# Patient Record
Sex: Male | Born: 1985 | Race: White | Hispanic: No | Marital: Single | Smoking: Former smoker
Health system: Southern US, Community
[De-identification: ages and names within clinical notes are randomized; demographics above are authoritative.]

---
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
## 2003-01-26 ENCOUNTER — Encounter: Admission: RE | Admit: 2003-01-26 | Discharge: 2003-01-26 | Payer: Self-pay | Admitting: Psychiatry

## 2003-10-12 ENCOUNTER — Other Ambulatory Visit (HOSPITAL_COMMUNITY): Admission: RE | Admit: 2003-10-12 | Discharge: 2003-10-21 | Payer: Self-pay | Admitting: Psychiatry

## 2005-02-26 ENCOUNTER — Ambulatory Visit (HOSPITAL_COMMUNITY): Payer: Self-pay | Admitting: Psychiatry

## 2005-07-03 ENCOUNTER — Ambulatory Visit (HOSPITAL_COMMUNITY): Payer: Self-pay | Admitting: Psychiatry

## 2007-01-15 ENCOUNTER — Ambulatory Visit: Payer: Self-pay | Admitting: Internal Medicine

## 2007-01-15 DIAGNOSIS — R51 Headache: Secondary | ICD-10-CM | POA: Insufficient documentation

## 2007-01-15 DIAGNOSIS — F329 Major depressive disorder, single episode, unspecified: Secondary | ICD-10-CM | POA: Insufficient documentation

## 2007-01-15 DIAGNOSIS — R519 Headache, unspecified: Secondary | ICD-10-CM | POA: Insufficient documentation

## 2007-01-15 DIAGNOSIS — F32A Depression, unspecified: Secondary | ICD-10-CM | POA: Insufficient documentation

## 2007-01-15 DIAGNOSIS — F988 Other specified behavioral and emotional disorders with onset usually occurring in childhood and adolescence: Secondary | ICD-10-CM | POA: Insufficient documentation

## 2007-01-15 LAB — CONVERTED CEMR LAB
ALT: 24 units/L (ref 0–53)
AST: 23 units/L (ref 0–37)
Albumin: 4 g/dL (ref 3.5–5.2)
Alkaline Phosphatase: 53 units/L (ref 39–117)
BUN: 11 mg/dL (ref 6–23)
Basophils Absolute: 0 10*3/uL (ref 0.0–0.1)
Basophils Relative: 0.7 % (ref 0.0–1.0)
Bilirubin Urine: NEGATIVE
Bilirubin, Direct: 0.2 mg/dL (ref 0.0–0.3)
CO2: 29 meq/L (ref 19–32)
Calcium: 9.3 mg/dL (ref 8.4–10.5)
Chloride: 105 meq/L (ref 96–112)
Cholesterol: 113 mg/dL (ref 0–200)
Creatinine, Ser: 1.1 mg/dL (ref 0.4–1.5)
Crystals: NEGATIVE
Eosinophils Absolute: 0.1 10*3/uL (ref 0.0–0.6)
Eosinophils Relative: 1.6 % (ref 0.0–5.0)
GFR calc Af Amer: 108 mL/min
GFR calc non Af Amer: 89 mL/min
Glucose, Bld: 103 mg/dL — ABNORMAL HIGH (ref 70–99)
HCT: 45.1 % (ref 39.0–52.0)
HDL: 26 mg/dL — ABNORMAL LOW (ref >39.0)
Hemoglobin, Urine: NEGATIVE
Hemoglobin: 16.1 g/dL (ref 13.0–17.0)
Ketones, ur: NEGATIVE mg/dL
LDL Cholesterol: 72 mg/dL (ref 0–99)
Leukocytes, UA: NEGATIVE
Lymphocytes Relative: 26.2 % (ref 12.0–46.0)
MCHC: 35.7 g/dL (ref 30.0–36.0)
MCV: 84.8 fL (ref 78.0–100.0)
Monocytes Absolute: 0.5 10*3/uL (ref 0.2–0.7)
Monocytes Relative: 7.8 % (ref 3.0–11.0)
Mucus, UA: NEGATIVE
Neutro Abs: 3.8 10*3/uL (ref 1.4–7.7)
Neutrophils Relative %: 63.7 % (ref 43.0–77.0)
Nitrite: NEGATIVE
Platelets: 213 10*3/uL (ref 150–400)
Potassium: 3.9 meq/L (ref 3.5–5.1)
RBC / HPF: NONE SEEN
RBC: 5.32 M/uL (ref 4.22–5.81)
RDW: 11.8 % (ref 11.5–14.6)
Sodium: 139 meq/L (ref 135–145)
Specific Gravity, Urine: 1.025 (ref 1.000–1.03)
TSH: 1.28 microintl units/mL (ref 0.35–5.50)
Total Bilirubin: 1.4 mg/dL — ABNORMAL HIGH (ref 0.3–1.2)
Total CHOL/HDL Ratio: 4.3
Total Protein, Urine: NEGATIVE mg/dL
Total Protein: 7 g/dL (ref 6.0–8.3)
Triglycerides: 77 mg/dL (ref 0–149)
Urine Glucose: NEGATIVE mg/dL
Urobilinogen, UA: 0.2 (ref 0.0–1.0)
VLDL: 15 mg/dL (ref 0–40)
WBC: 5.9 10*3/uL (ref 4.5–10.5)
pH: 6 (ref 5.0–8.0)

## 2007-09-01 ENCOUNTER — Emergency Department (HOSPITAL_COMMUNITY): Admission: EM | Admit: 2007-09-01 | Discharge: 2007-09-01 | Payer: Self-pay | Admitting: Emergency Medicine

## 2007-09-03 ENCOUNTER — Ambulatory Visit: Payer: Self-pay | Admitting: Internal Medicine

## 2007-09-03 DIAGNOSIS — K047 Periapical abscess without sinus: Secondary | ICD-10-CM | POA: Insufficient documentation

## 2007-12-22 ENCOUNTER — Emergency Department (HOSPITAL_COMMUNITY): Admission: EM | Admit: 2007-12-22 | Discharge: 2007-12-22 | Payer: Self-pay | Admitting: Emergency Medicine

## 2008-11-09 ENCOUNTER — Telehealth (INDEPENDENT_AMBULATORY_CARE_PROVIDER_SITE_OTHER): Payer: Self-pay | Admitting: *Deleted

## 2008-11-18 ENCOUNTER — Ambulatory Visit: Payer: Self-pay | Admitting: Internal Medicine

## 2008-11-18 DIAGNOSIS — J069 Acute upper respiratory infection, unspecified: Secondary | ICD-10-CM | POA: Insufficient documentation

## 2009-06-21 ENCOUNTER — Telehealth: Payer: Self-pay | Admitting: Internal Medicine

## 2010-07-18 NOTE — Progress Notes (Signed)
Summary: rx refill  Phone Note Call from Patient   Caller: Patient [phone] Summary of Call: pt called requesting refillls of Adderall XR 30mg  Initial call taken by: Margaret Pyle, CMA,  June 21, 2009 4:57 PM  Follow-up for Phone Call        done hardcopy to LIM side B - dahlia  Follow-up by: Corwin Levins MD,  June 21, 2009 5:37 PM  Additional Follow-up for Phone Call Additional follow up Details #1::        pt's father informed, rx in cabinet for pt pick up Additional Follow-up by: Margaret Pyle, CMA,  June 22, 2009 8:32 AM    New/Updated Medications: ADDERALL XR 30 MG XR24H-CAP (AMPHETAMINE-DEXTROAMPHETAMINE) 1 by mouth once daily - to fill Jun 22, 2009 Prescriptions: ADDERALL XR 30 MG XR24H-CAP (AMPHETAMINE-DEXTROAMPHETAMINE) 1 by mouth once daily - to fill Jun 22, 2009  #30 x 0   Entered and Authorized by:   Corwin Levins MD   Signed by:   Corwin Levins MD on 06/21/2009   Method used:   Print then Give to Patient   RxID:   [id number]

## 2012-10-18 ENCOUNTER — Encounter (HOSPITAL_COMMUNITY): Payer: Self-pay | Admitting: Emergency Medicine

## 2012-10-18 ENCOUNTER — Emergency Department (HOSPITAL_COMMUNITY)
Admission: EM | Admit: 2012-10-18 | Discharge: 2012-10-18 | Disposition: A | Discharge status: Home / Self Care | Payer: Self-pay | Attending: Emergency Medicine | Admitting: Emergency Medicine

## 2012-10-18 DIAGNOSIS — K029 Dental caries, unspecified: Secondary | ICD-10-CM | POA: Insufficient documentation

## 2012-10-18 DIAGNOSIS — Z87891 Personal history of nicotine dependence: Secondary | ICD-10-CM | POA: Insufficient documentation

## 2012-10-18 DIAGNOSIS — K089 Disorder of teeth and supporting structures, unspecified: Secondary | ICD-10-CM | POA: Insufficient documentation

## 2012-10-18 DIAGNOSIS — K047 Periapical abscess without sinus: Secondary | ICD-10-CM | POA: Insufficient documentation

## 2012-10-18 MED ORDER — METHYLPREDNISOLONE SODIUM SUCC 125 MG IJ SOLR: 125.0000 mg | Freq: Once | INTRAMUSCULAR | Status: DC

## 2012-10-18 MED ORDER — ONDANSETRON 4 MG PO TBDP
4.0000 mg | ORAL_TABLET | Freq: Once | ORAL | Status: AC
  Administered 2012-10-18: 4 mg via ORAL
  Filled 2012-10-18: qty 1

## 2012-10-18 MED ORDER — HYDROCODONE-ACETAMINOPHEN 5-325 MG PO TABS: 1.0000 | ORAL_TABLET | ORAL | Status: DC | PRN

## 2012-10-18 MED ORDER — PENICILLIN V POTASSIUM 500 MG PO TABS: 500.0000 mg | ORAL_TABLET | Freq: Four times a day (QID) | ORAL | Status: AC

## 2012-10-18 MED ORDER — METHYLPREDNISOLONE SODIUM SUCC 125 MG IJ SOLR
125.0000 mg | Freq: Once | INTRAMUSCULAR | Status: AC
  Administered 2012-10-18: 125 mg via INTRAMUSCULAR
  Filled 2012-10-18: qty 2

## 2012-10-18 NOTE — ED Provider Notes (Signed)
History  This chart was scribed for non-physician practitioner working with Gwyneth Sprout, MD by Ardeen Jourdain, ED Scribe. This patient was seen in room WTR7/WTR7 and the patient's care was started at  1503.  CSN: [id number]  Arrival date & time 10/18/12  1411   First MD Initiated Contact with Patient 10/18/12 1503      Chief Complaint  Patient presents with  . Oral Swelling     The history is provided by the patient. No language interpreter was used.    Derrick Goodman is a 27 y.o. male who presents to the Emergency Department complaining of gradually worsening oral swelling and pain of his right upper gums and roof of his mouth beginning 2 days ago.  Notes there was some bleeding from his gums earlier today when he brushed his teeth.  Notes that he is aware his teeth are in poor dentition but is uninsured and has not been able to see a dentist in quite some time.  Denies any fever, sweats, chills, difficulty swallowing or breathing.  No trismus.  Prior dental abscess with similar sx.  History reviewed. No pertinent past medical history.  History reviewed. No pertinent past surgical history.  No family history on file.  History  Substance Use Topics  . Smoking status: Former Smoker -- 0.25 packs/day for 1.5 years    Types: Cigarettes    Quit date: 06/19/2007  . Smokeless tobacco: Never Used  . Alcohol Use: Yes     Comment: Occassinally       Review of Systems  HENT: Positive for dental problem.   All other systems reviewed and are negative.    Allergies  Oxycodone-acetaminophen  Home Medications  No current outpatient prescriptions on file.  Triage Vitals: BP 139/87  Pulse 75  Temp(Src) 99.1 F (37.3 C) (Oral)  Resp 18  SpO2 100%  Physical Exam  Nursing note and vitals reviewed. Constitutional: He is oriented to person, place, and time. He appears well-developed and well-nourished.  HENT:  Head: Normocephalic and atraumatic. No trismus in the jaw.   Mouth/Throat: Uvula is midline, oropharynx is clear and moist and mucous membranes are normal. Abnormal dentition. Dental abscesses and dental caries present. No oropharyngeal exudate, posterior oropharyngeal edema, posterior oropharyngeal erythema or tonsillar abscesses.    TTP of right upper molars, localized swelling and erythema- concern for dental abscess; no oropharyngeal edema or erythema, handling secretions appropriately, no trismus  Eyes: Conjunctivae and EOM are normal. Pupils are equal, round, and reactive to light.  Neck: Normal range of motion. Neck supple.  Cardiovascular: Normal rate, regular rhythm and normal heart sounds.   Pulmonary/Chest: Effort normal and breath sounds normal.  Musculoskeletal: Normal range of motion.  Neurological: He is alert and oriented to person, place, and time.  Skin: Skin is warm and dry.  Psychiatric: He has a normal mood and affect.    ED Course  Procedures (including critical care time)  DIAGNOSTIC STUDIES: Oxygen Saturation is 100% on room air, normal by my interpretation.    COORDINATION OF CARE:  3:08 PM-Discussed treatment plan which includes antibiotics and pain medication with pt at bedside and pt agreed to plan.    Labs Reviewed - No data to display No results found.   1. Dental abscess       MDM   Right upper molar broken, TTP.  Localized swelling and erythema- concern for possible dental abscess formation.  Solu-medrol given in the ED.  Rx pen VK, vicodin.  FU with dentist-  Dr. Oswaldo Done.  Discussed plan with pt- he agreed.  Return precautions advised.     I personally performed the services described in this documentation, which was scribed in my presence. The recorded information has been reviewed and is accurate.    Garlon Hatchet, PA-C 10/18/12 1734

## 2012-10-18 NOTE — ED Notes (Signed)
Pt c/o 4/10 pain located in the roof of his mouth. Pt states he noticed the swelling this morning and is concerned about infection. Pt also reports headache, nausea, and headache.

## 2012-10-18 NOTE — ED Provider Notes (Signed)
Medical screening examination/treatment/procedure(s) were performed by non-physician practitioner and as supervising physician I was immediately available for consultation/collaboration.   Gwyneth Sprout, MD 10/18/12 2312

## 2014-04-25 ENCOUNTER — Encounter (HOSPITAL_COMMUNITY): Payer: Self-pay | Admitting: Emergency Medicine

## 2014-04-25 ENCOUNTER — Emergency Department (HOSPITAL_COMMUNITY)
Admission: EM | Admit: 2014-04-25 | Discharge: 2014-04-25 | Disposition: A | Discharge status: Home / Self Care | Payer: BC Managed Care – PPO | Attending: Emergency Medicine | Admitting: Emergency Medicine

## 2014-04-25 ENCOUNTER — Emergency Department (HOSPITAL_COMMUNITY): Payer: BC Managed Care – PPO

## 2014-04-25 DIAGNOSIS — R0789 Other chest pain: Secondary | ICD-10-CM | POA: Diagnosis not present

## 2014-04-25 DIAGNOSIS — R079 Chest pain, unspecified: Secondary | ICD-10-CM | POA: Diagnosis present

## 2014-04-25 DIAGNOSIS — Z791 Long term (current) use of non-steroidal anti-inflammatories (NSAID): Secondary | ICD-10-CM | POA: Diagnosis not present

## 2014-04-25 DIAGNOSIS — Z87891 Personal history of nicotine dependence: Secondary | ICD-10-CM | POA: Insufficient documentation

## 2014-04-25 LAB — I-STAT CHEM 8, ED
BUN: 12 mg/dL (ref 6–23)
CREATININE: 1.1 mg/dL (ref 0.50–1.35)
Calcium, Ion: 1.15 mmol/L (ref 1.12–1.23)
Chloride: 102 mEq/L (ref 96–112)
Glucose, Bld: 113 mg/dL — ABNORMAL HIGH (ref 70–99)
HCT: 51 % (ref 39.0–52.0)
HEMOGLOBIN: 17.3 g/dL — AB (ref 13.0–17.0)
Potassium: 4 mEq/L (ref 3.7–5.3)
SODIUM: 140 meq/L (ref 137–147)
TCO2: 35 mmol/L (ref 0–100)

## 2014-04-25 LAB — CBC WITH DIFFERENTIAL/PLATELET
Basophils Absolute: 0 10*3/uL (ref 0.0–0.1)
Basophils Relative: 0 % (ref 0–1)
Eosinophils Absolute: 0.2 10*3/uL (ref 0.0–0.7)
Eosinophils Relative: 2 % (ref 0–5)
HEMATOCRIT: 48.2 % (ref 39.0–52.0)
HEMOGLOBIN: 16.9 g/dL (ref 13.0–17.0)
LYMPHS PCT: 28 % (ref 12–46)
Lymphs Abs: 2.1 10*3/uL (ref 0.7–4.0)
MCH: 29.2 pg (ref 26.0–34.0)
MCHC: 35.1 g/dL (ref 30.0–36.0)
MCV: 83.2 fL (ref 78.0–100.0)
MONO ABS: 0.6 10*3/uL (ref 0.1–1.0)
MONOS PCT: 9 % (ref 3–12)
Neutro Abs: 4.5 10*3/uL (ref 1.7–7.7)
Neutrophils Relative %: 61 % (ref 43–77)
Platelets: 217 10*3/uL (ref 150–400)
RBC: 5.79 MIL/uL (ref 4.22–5.81)
RDW: 12.2 % (ref 11.5–15.5)
WBC: 7.5 10*3/uL (ref 4.0–10.5)

## 2014-04-25 LAB — I-STAT TROPONIN, ED: Troponin i, poc: 0 ng/mL (ref 0.00–0.08)

## 2014-04-25 MED ORDER — GI COCKTAIL ~~LOC~~
30.0000 mL | Freq: Once | ORAL | Status: AC
  Administered 2014-04-25: 30 mL via ORAL
  Filled 2014-04-25: qty 30

## 2014-04-25 MED ORDER — KETOROLAC TROMETHAMINE 30 MG/ML IJ SOLN
30.0000 mg | Freq: Once | INTRAMUSCULAR | Status: AC
  Administered 2014-04-25: 30 mg via INTRAVENOUS
  Filled 2014-04-25: qty 1

## 2014-04-25 MED ORDER — NAPROXEN 500 MG PO TABS: 500.0000 mg | ORAL_TABLET | Freq: Two times a day (BID) | ORAL | Status: DC

## 2014-04-25 NOTE — Discharge Instructions (Signed)
Please follow the directions provided.  Be sure to establish care with a primary care provider to follow-up on your pain today and to discuss issues with anxiety.  Get plenty of rest today eat a balanced diet and drink adequate amount of non-alcoholic fluids.  You may take naproxen 500 mg by mouth twice a day for this pain.  Don't hesitate to return for new, worsening or concerning symptoms.    SEEK IMMEDIATE MEDICAL CARE IF:  You have increased chest pain or pain that spreads to your arm, neck, jaw, back, or abdomen.  You have shortness of breath.  You have an increasing cough, or you cough up blood.  You have severe back or abdominal pain.  You feel nauseous or vomit.  You have severe weakness.  You faint.  You have chills. This is an emergency. Do not wait to see if the pain will go away. Get medical help at once. Call your local emergency services (911 in U.S[place] Do not drive yourself to the hospital.   Emergency Department Resource Guide 1) Find a Doctor and Pay Out of Pocket Although you won't have to find out who is covered by your insurance plan, it is a good idea to ask around and get recommendations. You will then need to call the office and see if the doctor you have chosen will accept you as a new patient and what types of options they offer for patients who are self-pay. Some doctors offer discounts or will set up payment plans for their patients who do not have insurance, but you will need to ask so you aren't surprised when you get to your appointment.  2) Contact Your Local Health Department Not all health departments have doctors that can see patients for sick visits, but many do, so it is worth a call to see if yours does. If you don't know where your local health department is, you can check in your phone book. The CDC also has a tool to help you locate your state's health department, and many state websites also have listings of all of their local health departments.  3) Find  a Walk-in Clinic If your illness is not likely to be very severe or complicated, you may want to try a walk in clinic. These are popping up all over the country in pharmacies, drugstores, and shopping centers. They're usually staffed by nurse practitioners or physician assistants that have been trained to treat common illnesses and complaints. They're usually fairly quick and inexpensive. However, if you have serious medical issues or chronic medical problems, these are probably not your best option.  No Primary Care Doctor: - Call Health Connect at  [phone] - they can help you locate a primary care doctor that  accepts your insurance, provides certain services, etc. - Physician Referral Service- [phone]  Chronic Pain Problems: Organization         Address  Phone   Notes  [facility]  [phone] Patients need to be referred by their primary care doctor.   Medication Assistance: Organization         Address  Phone   Notes  [facility] Medication [facility] [address] [place], [place] [address] [phone] --Must be a resident of [facility] -- Must have NO insurance coverage whatsoever (no Medicaid/ Medicare, etc.) -- The pt. MUST have a primary care doctor that directs their care regularly and follows them in the community   MedAssist  (  [phone]) [phone]   [employer]  [phone]    Agencies that provide inexpensive medical care: Organization         Address  Phone   Notes  [facility]  [phone]   [facility]    [phone](336) [phone]   [facility] [address] [place], [place] [address] [phone](336) [phone]   [facility] [address], [place] [phone](336) [phone]   [facility]anned Parenthood    [phone](336) [phone]   [facility]    [phone](336) [phone]   [facility]  [address], [place] Phone:  [phone](336) [phone], Fax:  [phone])  [phone] Hours of Operation:  9 am - 6 pm, M-F.  Also accepts Medicaid/Medicare and self-pay.  [facility]  [address], [place] Phone: [phone]) [phone], Fax: [phone](336) [phone]. Hours of Operation:  8:30 am - 5:30 pm, M-F.  Also accepts Medicaid and self-pay.  [facility] [place] [address], [place] Phone: [phone](336) [phone]   [facility] [address], [place], [place] [phone], Ext. [phone] Mondays & Thursdays: 7-9 AM.  First 15 patients are seen on a first come, first serve basis.    Medicaid-accepting [facility] Providers:  Organization         Address  Phone   Notes  [facility] [address], [place] [phone](336) [phone] Also accepts self-pay patients.  [facility] [address], [place]  [phone](336) [phone]   [facility] [address], [place] [phone](336) [phone]   [facility] [address] [place] [address], [place] [phone](336) [phone]   Renaye RakersVeita Bland [address], [place]   [phone]) [phone] Only accepts [place] Access [place] patients after they have their name applied to their card.   Self-Pay (no insurance) in [facility]:  Organization         Address  Phone   Notes  Sickle Cell Patients, [facility] [address], [place] [phone]) [phone]   [facility] [address], [place] [phone]) [phone]   [facility] [place]  [address], [place] [phone](336) [phone]   [facility]/Dr. Osei-Bonsu  [address] [place] [address], [place] or [phone], [place] [phone](336) [phone] Phone number for both [place] and [place] locations is the same.  [facility] [address], [place] [phone](336) [phone]   [facility] [place] [address] [place] [address], [place] or [address] [phone]) [phone]) [phone]     [facility] [address], [place] [phone](336) [phone], phone; [phone](336) [phone], fax Sees patients 1st and 3rd Saturday of every month.  Must not qualify for public or private insurance (i.e. Medicaid, Medicare, [place] Health Choice, Veterans' Benefits)  Household income should be no more than 200% of the poverty level The clinic cannot treat you if you are pregnant or think you are pregnant  Sexually transmitted diseases are not treated at the clinic.    Dental Care: Organization         Address  Phone  Notes  [facility] of [facility] [address], [place] [phone]) [phone] Accepts children up to age 30 who are enrolled in [place]icaid or [place] Health Choice; pregnant  women with a Medicaid card; and children who have applied for Medicaid or [place] Health Choice, but were declined, whose parents can pay a reduced fee at time of service.  [facility] of [facility]  [address], [place] [phone] Accepts children up to age 54 who are enrolled in [place] or [place] Health Choice; pregnant women with a Medicaid card; and children who have applied for Medicaid or [place] Health Choice, but were declined, whose parents can pay a reduced fee at time of service.  [facility]  [address] [place], [place] [phone] Patients are seen by appointment only. Walk-ins are not accepted. [facility] will see patients 52 years of age and older. Monday - Tuesday (8am-5pm) Most Wednesdays (8:30-5pm) $30 per visit, cash only  [facility] Adult [facility] Access PRO[facility]  [address], [facility] [phone] Patients are seen by appointment only. Walk-ins are not accepted. [facility] will see patients 75 years of age and older. One Wednesday Evening (Monthly: Volunteer Based).  $30 per visit, cash only  [employer]  [phone] for adults; Children under age 30, call  Graduate Pediatric Dentistry at [phone]. Children aged 24-14, please call [phone] to request a pediatric application.  Dental services are provided in all areas of dental care including fillings, crowns and bridges, complete and partial dentures, implants, gum treatment, root canals, and extractions. Preventive care is also provided. Treatment is provided to both adults and children. Patients are selected via a lottery and there is often a waiting list.   [facility] [address], [place]  [phone] [email or web address]   [facility] [address] [place], [place] [phone], Ext. [phone] Second and Fourth Thursday of each month, opens at 6:30 AM; Clinic ends at 9 AM.  Patients are seen on a first-come first-served basis, and a limited number are seen during each clinic.   [facility]  [address] [place], [place] [phone]   Eligibility Requirements You must have lived in [place], [place], or [place] counties for at least the last three months.   You cannot be eligible for state or federal sponsored [place], including [employer], [place], or [employer].   You generally cannot be eligible for healthcare insurance through your employer.    How to apply: Eligibility screenings are held every Tuesday and Wednesday afternoon from 1:00 pm until 4:00 pm. You do not need an appointment for the interview!  [facility] [address] [place], [place] [phone]   [facility] Health Department  [phone]   [facility] Health Department  [phone]   [facility] Health Department  [phone]    Behavioral Health Resources in the Community: Intensive Outpatient Programs Organization         Address  Phone  Notes  [facility] [address], [place], [place] [phone]   [facility] Outpatient [address] [place], [place]  [phone]   ADS: Alcohol & Drug Svcs [address], [place], [place]  [phone]   [facility] [address]  [place], [place] [phone] or [phone]   Substance Abuse Resources Organization         Address  Phone  Notes  Alcohol and Drug Services  [phone]   [facility]  [phone]   [place]  [phone]  [facility]  [phone]   Residential & Outpatient Substance Abuse Program  [phone]   Psychological Services Organization         Address  Phone  Notes  [facility]  [phone]- [phone]   [facility]  [phone]- [phone]   [facility] [address], [place] [phone] or [phone]    Mobile Crisis Teams Organization         Address  Phone  Notes  [facility], Mobile Crisis Care Unit  [phone]   [facility]  [address] [place], [place] [phone]   Doristine LocksSharon DeEsch [address] [place] [phone]    Self-Help/Support Groups Organization         Address  Phone             Notes  Mental Health Assoc. of [place] - variety of support groups  336- [phone] Call for more information  Narcotics Anonymous (NA), [facility] [address], [place]  2 meetings at this location   Statisticianesidential Treatment Programs Organization         Address  Phone  Notes  [facility] [address],    [place]  [phone]   [facility]  [address], [place] [phone], [place], [place] [phone] Floydene Flock  [facility] [address], [place] [phone] Admissions: 8am-3pm M-F  [facility] [address]    [place], [place] [phone]   [facility] [address], [place], [place] [phone]   [facility] [address]  [place], [place] [phone]   [facility] - Intensive Outpatient [address], [place], [place] [phone]   [facility] ([facility] Recovery Care [facility] [address]  [place], [place] [phone] or [phone]   [facility] (RTS) [address] [place], [place] [phone] Accepts Medicaid  [facility] [address]  [place] [phone] Substance Abuse/Addiction Treatment   [facility] Behavioral Health Resources Organization         Address  Phone  Notes  [facility]  [phone]) [phone]   Angie FavaJulie Brannon, PhD [address], [address] [place], [place]   [phone](336) [phone] or [phone](336) [phone]   [facility]   [address] [place], [place] [phone](336) [phone]   [facility] [address], [place], [place] [phone](336) [phone] Insurance/Medicaid/sponsorship through [facility]  [facility] Families [address]                                    [place], [place] [phone](336) [phone] Therapy/tele-psych/case  [facility] [address].   [place], [place] [phone](336) [phone]    Dr. Lolly MustacheArfeen  [phone](336) [phone]   [facility]  [employer] [facility] 1) [address], [place] 2) [address], [place] 3)  [address], [place] [phone](336) [phone](336) [phone]  [phone](336) [phone]   [facility] Child Abuse Hotline [phone](336) [phone] or [phone](336) [phone] (After Hours)
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
## 2014-04-25 NOTE — ED Notes (Signed)
Pt escorted to discharge window. Pt verbalized understanding discharge instructions. In no acute distress.  

## 2014-04-25 NOTE — ED Notes (Signed)
Pt alert and oriented x4. Respirations even and unlabored, bilateral symmetrical rise and fall of chest. Skin warm and dry. In no acute distress. Denies needs.   
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
## 2016-03-19 ENCOUNTER — Ambulatory Visit (INDEPENDENT_AMBULATORY_CARE_PROVIDER_SITE_OTHER): Payer: Self-pay | Admitting: Family Medicine

## 2016-03-19 VITALS — BP 130/90 | HR 75 | Temp 97.3°F | Resp 17 | Ht 67.0 in | Wt 190.0 lb

## 2016-03-19 DIAGNOSIS — K047 Periapical abscess without sinus: Secondary | ICD-10-CM

## 2016-03-19 MED ORDER — MELOXICAM 15 MG PO TABS: 15.0000 mg | ORAL_TABLET | Freq: Every day | ORAL | 0 refills | Status: DC

## 2016-03-19 MED ORDER — AMOXICILLIN 500 MG PO CAPS: 500.0000 mg | ORAL_CAPSULE | Freq: Two times a day (BID) | ORAL | 0 refills | Status: DC

## 2016-03-19 NOTE — Progress Notes (Signed)
   Subjective:    Patient ID: Derrick Goodman, male    DOB: Aug 22, 1985, 30 y.o.   MRN: [id number]  Chief Complaint  Patient presents with  . jaw pain  . Hypertension    PCP: Oliver BarreJames John, MD  HPI  This is a 30 y.o. male who is presenting with jaw pain. Had TMJ on Thursday and Friday. He hasn't slept due to pain on the right side. He called his dentist and isn't able to be seen. Pain is on top and bottom. Has planned on pulling his teeth for partial dentures. No dental procedures for the past 6 months. No recent antibiotics. Having throbbing in the right side. Has had emesis. No fevers, chills, or night sweats. No trouble swallowing or breathing.  Has tried ibuprofen with no improvement.   Blood pressure is elevated but no history of HTN. He has been in pain from his teeth.  .   Review of Systems  ROS: No unexpected weight loss, fever, chills, swelling, instability, muscle pain, numbness/tingling, redness, otherwise see HPI   PMH: dental abscess   PShx: none  PSx: no tobacco or alcohol use  FHx: DM    Objective:   Physical Exam BP 130/90   Pulse 75   Temp 97.3 F (36.3 C) (Oral)   Resp 17   Ht 5\' 7"  (1.702 m)   Wt 190 lb (86.2 kg)   SpO2 97%   BMI 29.76 kg/m  Gen: NAD, alert, cooperative with exam, well-appearing HEENT: TM's clear and intact, no cervical LAD, no tonsillar swelling or exudates, several tooth caries, no ttp of the cheek,   Resp:  non-labored Skin: no rashes, normal turgor  Neuro: no gross deficits.  Psych:  alert and oriented      Assessment & Plan:   ABSCESS, TOOTH Pain is most likely related to abscess. No problems swallowing or breathing. He has several dental caries.  - amoxicllin but informed he doesn't have to complete if he gets his teeth pulled  - mobic for pain  - advised to f/u in regards to his blood pressure.

## 2016-03-19 NOTE — Assessment & Plan Note (Signed)
Pain is most likely related to abscess. No problems swallowing or breathing. He has several dental caries.  - amoxicllin but informed he doesn't have to complete if he gets his teeth pulled  - mobic for pain  - advised to f/u in regards to his blood pressure.

## 2016-03-19 NOTE — Patient Instructions (Addendum)
  Thank you for coming in,   Please let us know if you are not able to get into your dentist soon.     Please feel free to call with any questions or concerns at any time, at [phone]. --Dr. Jordan LikesSchmitz    IF you received an x-ray today, you will receive an invoice from [facility]. Please contact [facility] at [phone] with questions or concerns regarding your invoice.   IF you received labwork today, you will receive an invoice from [facility]. Please contact [facility] at [phone] with questions or concerns regarding your invoice.   Our billing staff will not be able to assist you with questions regarding bills from these companies.  You will be contacted with the lab results as soon as they are available. The fastest way to get your results is to activate your My Chart account. Instructions are located on the last page of this paperwork. If you have not heard from us regarding the results in 2 weeks, please contact this office.

## 2016-03-19 NOTE — Progress Notes (Addendum)
Patient discussed with Dr. Jordan LikesSchmitz. Agree with findings, assessment and plan of care per his note.  Signed,   Meredith StaggersJeffrey Dereon Williamsen, MD [facility].  03/21/16 11:42 AM

## 2018-02-10 ENCOUNTER — Ambulatory Visit: Payer: Self-pay | Admitting: Family Medicine

## 2018-03-10 ENCOUNTER — Ambulatory Visit: Payer: Self-pay | Admitting: Family Medicine

## 2018-10-23 ENCOUNTER — Emergency Department (HOSPITAL_COMMUNITY)
Admission: EM | Admit: 2018-10-23 | Discharge: 2018-10-24 | Disposition: A | Discharge status: Home / Self Care | Payer: No Typology Code available for payment source | Attending: Emergency Medicine | Admitting: Emergency Medicine

## 2018-10-23 ENCOUNTER — Other Ambulatory Visit: Payer: Self-pay

## 2018-10-23 ENCOUNTER — Encounter (HOSPITAL_COMMUNITY): Payer: Self-pay

## 2018-10-23 DIAGNOSIS — K0889 Other specified disorders of teeth and supporting structures: Secondary | ICD-10-CM | POA: Diagnosis not present

## 2018-10-23 DIAGNOSIS — K029 Dental caries, unspecified: Secondary | ICD-10-CM | POA: Diagnosis not present

## 2018-10-23 DIAGNOSIS — R509 Fever, unspecified: Secondary | ICD-10-CM | POA: Insufficient documentation

## 2018-10-23 NOTE — ED Triage Notes (Signed)
Pt woke up today with swelling to the R side of upper molar. Reports fevers today

## 2018-10-24 MED ORDER — AMOXICILLIN-POT CLAVULANATE 875-125 MG PO TABS: 1.0000 | ORAL_TABLET | Freq: Two times a day (BID) | ORAL | 0 refills | Status: AC

## 2018-10-24 NOTE — Discharge Instructions (Addendum)
You were given a prescription for antibiotics. Please take the antibiotic prescription fully.   Please follow-up with a dentist in the next 5 to 7 days for reevaluation.  If you do not have a dentist, resources were provided for dentist in the area in your discharge summary.  Please contact one of the offices that are listed and make an appointment for follow-up.  Please return to the emergency department for any new or worsening symptoms.  

## 2018-10-24 NOTE — ED Provider Notes (Signed)
[facility] Provider Note   CSN: [id number] Arrival date & time: 10/23/18  2340    History   Chief Complaint Chief Complaint  Patient presents with  . Oral Swelling    HPI Derrick Goodman is a 33 y.o. male.     HPI   Patient is a 33 year old male who presents to the emergency department today complaining of right upper dental pain that began earlier today.  Reports that later on in the day he developed increased pain and swelling to the right upper portion of his mouth.  Pain is constant and severe in nature.  Denies exacerbating or alleviating factors.  States he had a temp of 100 at home.  Denies any other symptoms including no nausea or vomiting.  He has had no drainage in his mouth.  No difficulty swallowing or sore throat.  States that he has a Education officer, communitydentist but did not try to contact them today.  History reviewed. No pertinent past medical history.  Patient Active Problem List   Diagnosis Date Noted  . URI 11/18/2008  . ABSCESS, TOOTH 09/03/2007  . DEPRESSION 01/15/2007  . ADD 01/15/2007  . HEADACHE 01/15/2007    History reviewed. No pertinent surgical history.      Home Medications    Prior to Admission medications   Medication Sig Start Date End Date Taking? Authorizing Provider  amoxicillin (AMOXIL) 500 MG capsule Take 1 capsule (500 mg total) by mouth 2 (two) times daily. 03/19/16   Myra RudeSchmitz, Josh E, MD  amoxicillin-clavulanate (AUGMENTIN) 875-125 MG tablet Take 1 tablet by mouth every 12 (twelve) hours for 7 days. 10/24/18 10/31/18  Shoshanah Dapper S, PA-C  meloxicam (MOBIC) 15 MG tablet Take 1 tablet (15 mg total) by mouth daily. 03/19/16   Myra RudeSchmitz, Lyrik E, MD    Family History No family history on file.  Social History Social History   Tobacco Use  . Smoking status: Former Smoker    Packs/day: 0.25    Years: 1.50    Pack years: 0.37    Types: Cigarettes    Last attempt to quit: 06/19/2007    Years since  quitting: 11.3  . Smokeless tobacco: Never Used  Substance Use Topics  . Alcohol use: Yes    Comment: Occassinally   . Drug use: No     Allergies   Oxycodone-acetaminophen   Review of Systems Review of Systems  Constitutional: Positive for fever.  HENT: Positive for dental problem.   Gastrointestinal: Negative for nausea and vomiting.  Neurological: Negative for headaches.     Physical Exam Updated Vital Signs BP (!) 143/99   Pulse 93   Temp 98.4 F (36.9 C)   Resp 20   SpO2 98%   Physical Exam Constitutional:      General: He is not in acute distress.    Appearance: He is well-developed. He is not ill-appearing or toxic-appearing.  HENT:     Nose: Nose normal.     Mouth/Throat:     Mouth: Mucous membranes are moist.     Pharynx: No oropharyngeal exudate or posterior oropharyngeal erythema.     Comments: Multiple missing teeth and dental caries.  Has tenderness to the right upper gumline without any obvious evidence of fluctuance.  No drainage noted.  No obvious periapical abscess. Eyes:     Conjunctiva/sclera: Conjunctivae normal.  Cardiovascular:     Rate and Rhythm: Normal rate and regular rhythm.  Pulmonary:     Effort: Pulmonary effort is normal.  Breath sounds: Normal breath sounds.  Skin:    General: Skin is warm and dry.  Neurological:     Mental Status: He is alert and oriented to person, place, and time.      ED Treatments / Results  Labs (all labs ordered are listed, but only abnormal results are displayed) Labs Reviewed - No data to display  EKG None  Radiology No results found.  Procedures Procedures (including critical care time)  Medications Ordered in ED Medications - No data to display   Initial Impression / Assessment and Plan / ED Course  I have reviewed the triage vital signs and the nursing notes.  Pertinent labs & imaging results that were available during my care of the patient were reviewed by me and considered in  my medical decision making (see chart for details).     Final Clinical Impressions(s) / ED Diagnoses   Final diagnoses:  Pain, dental   Patient with toothache.  Multiple missing teeth and dental caries.  Has tenderness to the right upper gumline without any obvious evidence of fluctuance.  No drainage noted.  No obvious periapical abscess. No gross abscess.  Exam unconcerning for Ludwig's angina or spread of infection.  Will treat with augmentin and pain medicine.  Urged patient to follow-up with dentist.  He states he will call his dentist tomorrow morning.  I also gave him resources to contact another dentist in the area if he is unable to have an appointment with his.  Advised and return to the ER for new or worsening symptoms.  He voices understanding of the plan and reasons to return.  All questions answered.  Patient stable for discharge.   ED Discharge Orders         Ordered    amoxicillin-clavulanate (AUGMENTIN) 875-125 MG tablet  Every 12 hours     10/24/18 0252           Karrie Meres, PA-C 10/24/18 0253    Nira Conn, MD 10/24/18 [phone]

## 2019-03-30 ENCOUNTER — Other Ambulatory Visit: Payer: Self-pay

## 2019-03-30 ENCOUNTER — Encounter (HOSPITAL_COMMUNITY): Payer: Self-pay | Admitting: Family Medicine

## 2019-03-30 ENCOUNTER — Ambulatory Visit (HOSPITAL_COMMUNITY)
Admission: EM | Admit: 2019-03-30 | Discharge: 2019-03-30 | Disposition: A | Discharge status: Home / Self Care | Payer: No Typology Code available for payment source | Attending: Family Medicine | Admitting: Family Medicine

## 2019-03-30 DIAGNOSIS — K2970 Gastritis, unspecified, without bleeding: Secondary | ICD-10-CM

## 2019-03-30 MED ORDER — SUCRALFATE 1 GM/10ML PO SUSP: 1.0000 g | Freq: Three times a day (TID) | ORAL | 0 refills | Status: AC

## 2019-03-30 MED ORDER — OMEPRAZOLE 20 MG PO CPDR: 20.0000 mg | DELAYED_RELEASE_CAPSULE | Freq: Two times a day (BID) | ORAL | 1 refills | Status: AC

## 2019-03-30 NOTE — Discharge Instructions (Signed)
If you are not improving with the omeprazole, stop the omeprazole and start taking the new liquid stomach prescription 3 times a day.  If this does not result in resolution, call the gastroenterologist below.

## 2019-03-30 NOTE — ED Triage Notes (Signed)
Pt presents with epigastric pain for past few days that is more active with eating.

## 2019-03-30 NOTE — ED Provider Notes (Signed)
[facility]    CSN: [id number] Arrival date & time: 03/30/19  1157      History   Chief Complaint Chief Complaint  Patient presents with  . Appointment  . (12:10) Abdominal Pain    HPI Derrick Goodman is a 33 y.o. male.   This is the initial [facility] urgent care visit for this 33 year old man who presents with abdominal pain.  Patient states that his epigastric pain began about 3 days ago.  He describes as gnawing and burning and located in the upper part of his abdomen.  He says it gets worse when he eats.  He has had no nausea, vomiting, or diarrhea.  He has had no hematochezia, fever, or prior episodes of this kind of pain.  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  Patient has been taking Tums but this has not done anything to the pain.  He rates pain is 5 out of 10.  He says that eating tends to make the pain a little worse.  It is not positional.     History reviewed. No pertinent past medical history.  Patient Active Problem List   Diagnosis Date Noted  . URI 11/18/2008  . ABSCESS, TOOTH 09/03/2007  . DEPRESSION 01/15/2007  . ADD 01/15/2007  . HEADACHE 01/15/2007    History reviewed. No pertinent surgical history.     Home Medications    Prior to Admission medications   Medication Sig Start Date End Date Taking? Authorizing Provider  omeprazole (PRILOSEC) 20 MG capsule Take 1 capsule (20 mg total) by mouth 2 (two) times daily before a meal. 03/30/19   Elvina Sidle, MD  sucralfate (CARAFATE) 1 GM/10ML suspension Take 10 mLs (1 g total) by mouth 4 (four) times daily -  with meals and at bedtime. 03/30/19   Elvina Sidle, MD    Family History Family History  Family history unknown: Yes    Social History Social History   Tobacco Use  . Smoking status: Former Smoker    Packs/day: 0.25    Years: 1.50    Pack years: 0.37    Types: Cigarettes    Quit date: 06/19/2007    Years since quitting: 11.7  .  Smokeless tobacco: Never Used  Substance Use Topics  . Alcohol use: Yes    Comment: Occassinally   . Drug use: No     Allergies   Oxycodone-acetaminophen   Review of Systems Review of Systems  Constitutional: Negative.   Gastrointestinal: Positive for abdominal pain. Negative for constipation, diarrhea, nausea and vomiting.  All other systems reviewed and are negative.    Physical Exam Triage Vital Signs ED Triage Vitals [03/30/19 1209]  Enc Vitals Group     BP (!) 141/93     Pulse Rate 90     Resp 16     Temp 98.1 F (36.7 C)     Temp Source Temporal     SpO2 99 %     Weight      Height      Head Circumference      Peak Flow      Pain Score      Pain Loc      Pain Edu?      Excl. in GC?    No data found.  Updated Vital Signs BP (!) 141/93 (BP Location: Left Arm)   Pulse 90   Temp 98.1 F (36.7 C) (Temporal)   Resp 16   SpO2 99%  Physical Exam Vitals signs and nursing note reviewed.  Constitutional:      Appearance: Normal appearance. He is normal weight.  Eyes:     Conjunctiva/sclera: Conjunctivae normal.  Neck:     Musculoskeletal: Normal range of motion.  Cardiovascular:     Rate and Rhythm: Normal rate and regular rhythm.     Heart sounds: Normal heart sounds.  Pulmonary:     Effort: Pulmonary effort is normal.     Breath sounds: Normal breath sounds.  Abdominal:     General: Bowel sounds are normal. There is no distension.     Palpations: There is no mass.     Tenderness: There is abdominal tenderness. There is no guarding or rebound.     Comments: Mild tenderness in epigastrium with deep palpation  Musculoskeletal: Normal range of motion.  Skin:    General: Skin is warm.  Neurological:     General: No focal deficit present.     Mental Status: He is alert and oriented to person, place, and time.  Psychiatric:        Mood and Affect: Mood normal.        Thought Content: Thought content normal.        Judgment: Judgment normal.       UC Treatments / Results  Labs (all labs ordered are listed, but only abnormal results are displayed) Labs Reviewed - No data to display  EKG   Radiology No results found.  Procedures Procedures (including critical care time)  Medications Ordered in UC Medications - No data to display  Initial Impression / Assessment and Plan / UC Course  I have reviewed the triage vital signs and the nursing notes.  Pertinent labs & imaging results that were available during my care of the patient were reviewed by me and considered in my medical decision making (see chart for details).    Final Clinical Impressions(s) / UC Diagnoses   Final diagnoses:  Gastritis and gastroduodenitis     Discharge Instructions     If you are not improving with the omeprazole, stop the omeprazole and start taking the new liquid stomach prescription 3 times a day.  If this does not result in resolution, call the gastroenterologist below.    ED Prescriptions    Medication Sig Dispense Auth. Provider   omeprazole (PRILOSEC) 20 MG capsule Take 1 capsule (20 mg total) by mouth 2 (two) times daily before a meal. 20 capsule Robyn Haber, MD   sucralfate (CARAFATE) 1 GM/10ML suspension Take 10 mLs (1 g total) by mouth 4 (four) times daily -  with meals and at bedtime. 420 mL Robyn Haber, MD     I have reviewed the PDMP during this encounter.   Robyn Haber, MD 03/30/19 1245

## 2019-04-11 ENCOUNTER — Other Ambulatory Visit: Payer: Self-pay

## 2019-04-11 ENCOUNTER — Emergency Department (HOSPITAL_COMMUNITY): Payer: No Typology Code available for payment source

## 2019-04-11 ENCOUNTER — Emergency Department (HOSPITAL_COMMUNITY)
Admission: EM | Admit: 2019-04-11 | Discharge: 2019-04-11 | Disposition: A | Discharge status: Home / Self Care | Payer: No Typology Code available for payment source | Attending: Emergency Medicine | Admitting: Emergency Medicine

## 2019-04-11 ENCOUNTER — Encounter (HOSPITAL_COMMUNITY): Payer: Self-pay | Admitting: Emergency Medicine

## 2019-04-11 DIAGNOSIS — W25XXXA Contact with sharp glass, initial encounter: Secondary | ICD-10-CM | POA: Insufficient documentation

## 2019-04-11 DIAGNOSIS — Z79899 Other long term (current) drug therapy: Secondary | ICD-10-CM | POA: Diagnosis not present

## 2019-04-11 DIAGNOSIS — Z87891 Personal history of nicotine dependence: Secondary | ICD-10-CM | POA: Diagnosis not present

## 2019-04-11 DIAGNOSIS — Y990 Civilian activity done for income or pay: Secondary | ICD-10-CM | POA: Insufficient documentation

## 2019-04-11 DIAGNOSIS — Y9289 Other specified places as the place of occurrence of the external cause: Secondary | ICD-10-CM | POA: Insufficient documentation

## 2019-04-11 DIAGNOSIS — S61215A Laceration without foreign body of left ring finger without damage to nail, initial encounter: Secondary | ICD-10-CM | POA: Diagnosis not present

## 2019-04-11 DIAGNOSIS — S6992XA Unspecified injury of left wrist, hand and finger(s), initial encounter: Secondary | ICD-10-CM | POA: Diagnosis present

## 2019-04-11 DIAGNOSIS — Y9389 Activity, other specified: Secondary | ICD-10-CM | POA: Diagnosis not present

## 2019-04-11 DIAGNOSIS — Z23 Encounter for immunization: Secondary | ICD-10-CM | POA: Diagnosis not present

## 2019-04-11 MED ORDER — TETANUS-DIPHTH-ACELL PERTUSSIS 5-2.5-18.5 LF-MCG/0.5 IM SUSP
0.5000 mL | Freq: Once | INTRAMUSCULAR | Status: AC
  Administered 2019-04-11: 20:00:00 0.5 mL via INTRAMUSCULAR
  Filled 2019-04-11: qty 0.5

## 2019-04-11 NOTE — ED Provider Notes (Signed)
[facility]-EMERGENCY DEPT Provider Note   CSN: [id number] Arrival date & time: 04/11/19  1933     History   Chief Complaint Chief Complaint  Patient presents with  . Laceration    HPI Derrick Goodman is a 33 y.o. male who presents to the ED today after sustaining laceration to left ring finger that occurred at 2:30 PM today.  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  He states that they had to break the mirror prior to throwing it in the dumpster.  He states he sliced his finger on the broken piece of mirror causing the laceration.  He states that it has continued to bleed minimally since then which prompted his ED visit today.  He is not anticoagulated.  Tetanus not up-to-date.      History reviewed. No pertinent past medical history.  Patient Active Problem List   Diagnosis Date Noted  . URI 11/18/2008  . ABSCESS, TOOTH 09/03/2007  . DEPRESSION 01/15/2007  . ADD 01/15/2007  . HEADACHE 01/15/2007    History reviewed. No pertinent surgical history.      Home Medications    Prior to Admission medications   Medication Sig Start Date End Date Taking? Authorizing Provider  omeprazole (PRILOSEC) 20 MG capsule Take 1 capsule (20 mg total) by mouth 2 (two) times daily before a meal. 03/30/19   Elvina Sidle, MD  sucralfate (CARAFATE) 1 GM/10ML suspension Take 10 mLs (1 g total) by mouth 4 (four) times daily -  with meals and at bedtime. 03/30/19   Elvina Sidle, MD    Family History Family History  Family history unknown: Yes    Social History Social History   Tobacco Use  . Smoking status: Former Smoker    Packs/day: 0.25    Years: 1.50    Pack years: 0.37    Types: Cigarettes    Quit date: 06/19/2007    Years since quitting: 11.8  . Smokeless tobacco: Never Used  Substance Use Topics  . Alcohol use: Yes    Comment: Occassinally   . Drug use: No     Allergies    Oxycodone-acetaminophen   Review of Systems Review of Systems  Constitutional: Negative for chills and fever.  Skin: Positive for wound.     Physical Exam Updated Vital Signs BP (!) 144/103 (BP Location: Right Arm)   Pulse 72   Temp 98 F (36.7 C) (Oral)   Resp 18   SpO2 99%   Physical Exam Vitals signs and nursing note reviewed.  Constitutional:      Appearance: He is not ill-appearing.  HENT:     Head: Normocephalic and atraumatic.  Eyes:     Conjunctiva/sclera: Conjunctivae normal.  Cardiovascular:     Rate and Rhythm: Normal rate and regular rhythm.  Pulmonary:     Effort: Pulmonary effort is normal.     Breath sounds: Normal breath sounds.  Musculoskeletal:     Comments: 1 cm superficial laceration noted to distal pad of left ring finger; bleeding controlled at this time. Sensation intact to finger. Cap refill < 2 seconds. ROM intact to MCP, PIP, and DIP joint. 2+ radial pulse  Skin:    General: Skin is warm and dry.     Coloration: Skin is not jaundiced.  Neurological:     Mental Status: He is alert.      ED Treatments / Results  Labs (all labs ordered are listed, but  only abnormal results are displayed) Labs Reviewed - No data to display  EKG None  Radiology Dg Finger Ring Left  Result Date: 04/11/2019 CLINICAL DATA:  32 year old male with laceration of the ring finger. EXAM: LEFT RING FINGER 2+V COMPARISON:  None. FINDINGS: There is no acute fracture or dislocation. The bones are well mineralized. No arthritic changes. The soft tissues are unremarkable. No radiopaque foreign object or soft tissue gas. IMPRESSION: Negative. Electronically Signed   By: Anner Crete M.D.   On: 04/11/2019 21:06    Procedures .Marland KitchenLaceration Repair  Date/Time: 04/11/2019 9:12 PM Performed by: Eustaquio Maize, PA-C Authorized by: Eustaquio Maize, PA-C   Consent:    Consent obtained:  Verbal   Consent given by:  Patient   Risks discussed:  Infection, pain and  poor cosmetic result Anesthesia (see MAR for exact dosages):    Anesthesia method:  None Laceration details:    Location:  Finger   Finger location:  L ring finger   Length (cm):  1   Depth (mm):  1 Repair type:    Repair type:  Simple Exploration:    Hemostasis achieved with:  Direct pressure   Contaminated: no   Treatment:    Area cleansed with:  Betadine   Amount of cleaning:  Standard   Irrigation solution:  Sterile saline Skin repair:    Repair method:  Tissue adhesive Approximation:    Approximation:  Close Post-procedure details:    Dressing:  Open (no dressing)   Patient tolerance of procedure:  Tolerated well, no immediate complications   (including critical care time)  Medications Ordered in ED Medications  Tdap (BOOSTRIX) injection 0.5 mL (0.5 mLs Intramuscular Given 04/11/19 2018)     Initial Impression / Assessment and Plan / ED Course  I have reviewed the triage vital signs and the nursing notes.  Pertinent labs & imaging results that were available during my care of the patient were reviewed by me and considered in my medical decision making (see chart for details).    33 year old male presents with laceration to left ring finger that occurred around 2 PM today.  Bleeding is controlled at this time although patient states it has been oozing blood since he was cut.  Tetanus is not up-to-date.  Will update this in the ED today.  Given it was cut on a piece of broken glass will obtain x-ray to ensure there is no foreign body.  Laceration superficial enough to be closed with Dermabond.  Xray negative for foreign body.  Wound closed with Dermabond.  Strict return precautions have been discussed with patient.  He is in agreement with plan at this time and stable for discharge home.  This note was prepared using Dragon voice recognition software and may include unintentional dictation errors due to the inherent limitations of voice recognition software.       Final Clinical Impressions(s) / ED Diagnoses   Final diagnoses:  Laceration of left ring finger without foreign body without damage to nail, initial encounter    ED Discharge Orders    None       Eustaquio Maize, PA-C 04/11/19 2113    Sherwood Gambler, MD 04/12/19 2351

## 2019-04-11 NOTE — ED Triage Notes (Addendum)
Patient reports cutting left ring finger on mirror. Reports tetanus shot is up to date. Bleeding controlled.

## 2019-04-11 NOTE — Discharge Instructions (Signed)
Your xray was negative for any foreign body We have closed your laceration with skin glue. Please do not get your finger wet for 24 hours. After that you can resume normal activity. I would recommend not soaking your hand in a bath or sink of water.  Return to the ED immediately for any signs of infection including redness around the wound, swelling, drainage of pus, or if you develop fever > 100.4, or chills

## 2019-05-28 ENCOUNTER — Emergency Department (HOSPITAL_COMMUNITY)
Admission: EM | Admit: 2019-05-28 | Discharge: 2019-05-28 | Discharge status: Against Medical Advice | Payer: No Typology Code available for payment source | Attending: Emergency Medicine | Admitting: Emergency Medicine

## 2019-05-28 ENCOUNTER — Other Ambulatory Visit: Payer: Self-pay

## 2019-05-28 ENCOUNTER — Encounter (HOSPITAL_COMMUNITY): Payer: Self-pay

## 2019-05-28 DIAGNOSIS — R55 Syncope and collapse: Secondary | ICD-10-CM | POA: Diagnosis present

## 2019-05-28 DIAGNOSIS — Z5321 Procedure and treatment not carried out due to patient leaving prior to being seen by health care provider: Secondary | ICD-10-CM | POA: Insufficient documentation

## 2019-05-28 LAB — URINALYSIS, ROUTINE W REFLEX MICROSCOPIC
Bacteria, UA: NONE SEEN
Bilirubin Urine: NEGATIVE
Glucose, UA: NEGATIVE mg/dL
Ketones, ur: 5 mg/dL — AB
Leukocytes,Ua: NEGATIVE
Nitrite: NEGATIVE
Protein, ur: NEGATIVE mg/dL
Specific Gravity, Urine: 1.021 (ref 1.005–1.030)
pH: 5 (ref 5.0–8.0)

## 2019-05-28 LAB — CBC
HCT: 47 % (ref 39.0–52.0)
Hemoglobin: 16.4 g/dL (ref 13.0–17.0)
MCH: 29.3 pg (ref 26.0–34.0)
MCHC: 34.9 g/dL (ref 30.0–36.0)
MCV: 84.1 fL (ref 80.0–100.0)
Platelets: 245 10*3/uL (ref 150–400)
RBC: 5.59 MIL/uL (ref 4.22–5.81)
RDW: 12.2 % (ref 11.5–15.5)
WBC: 11 10*3/uL — ABNORMAL HIGH (ref 4.0–10.5)
nRBC: 0 % (ref 0.0–0.2)

## 2019-05-28 LAB — BASIC METABOLIC PANEL
Anion gap: 9 (ref 5–15)
BUN: 7 mg/dL (ref 6–20)
CO2: 24 mmol/L (ref 22–32)
Calcium: 9.1 mg/dL (ref 8.9–10.3)
Chloride: 105 mmol/L (ref 98–111)
Creatinine, Ser: 1.14 mg/dL (ref 0.61–1.24)
GFR calc Af Amer: >60 mL/min (ref >60)
GFR calc non Af Amer: >60 mL/min (ref >60)
Glucose, Bld: 121 mg/dL — ABNORMAL HIGH (ref 70–99)
Potassium: 3.6 mmol/L (ref 3.5–5.1)
Sodium: 138 mmol/L (ref 135–145)

## 2019-05-28 MED ORDER — SODIUM CHLORIDE 0.9% FLUSH: 3.0000 mL | Freq: Once | INTRAVENOUS | Status: DC

## 2019-05-28 NOTE — ED Notes (Signed)
Pt updated about wait times 

## 2019-05-28 NOTE — ED Notes (Signed)
Pt said that he had a tooth extraction this afternoon and thinks he only was weak because of this. States he will follow up in the a.m. with his dentist.

## 2019-05-28 NOTE — ED Triage Notes (Signed)
Pt states that he had a tooth pulled today, afterwards had fever of 101.5, vomited several times and near syncopal episode.

## 2020-05-27 IMAGING — CR DG FINGER RING 2+V*L*
3 series · 3 of 3 positions shown · non-contrast
Comparison: None.

CLINICAL DATA: 33-year-old male with laceration of the ring finger.

EXAM:
LEFT RING FINGER 2+V

[x finger pa left]
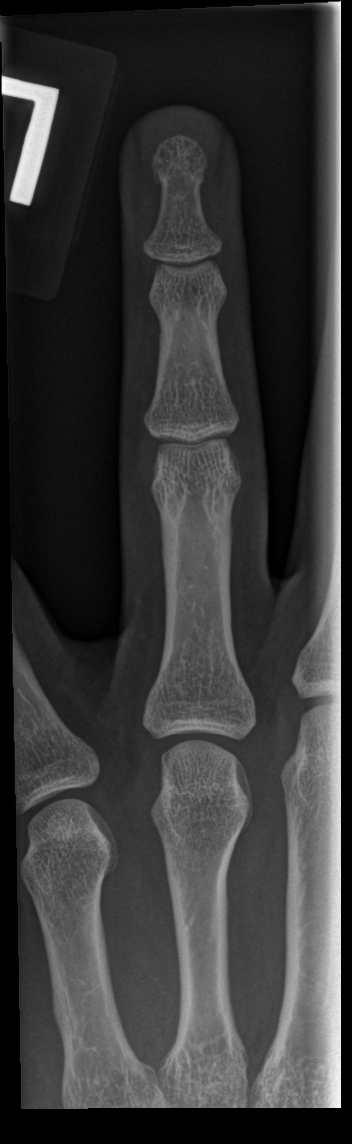

[x finger obl left]
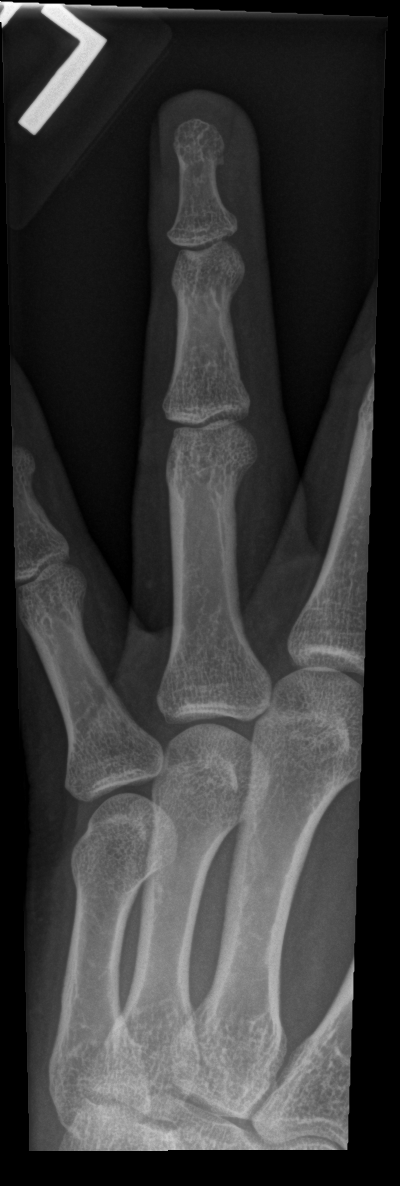

[x finger lat left]
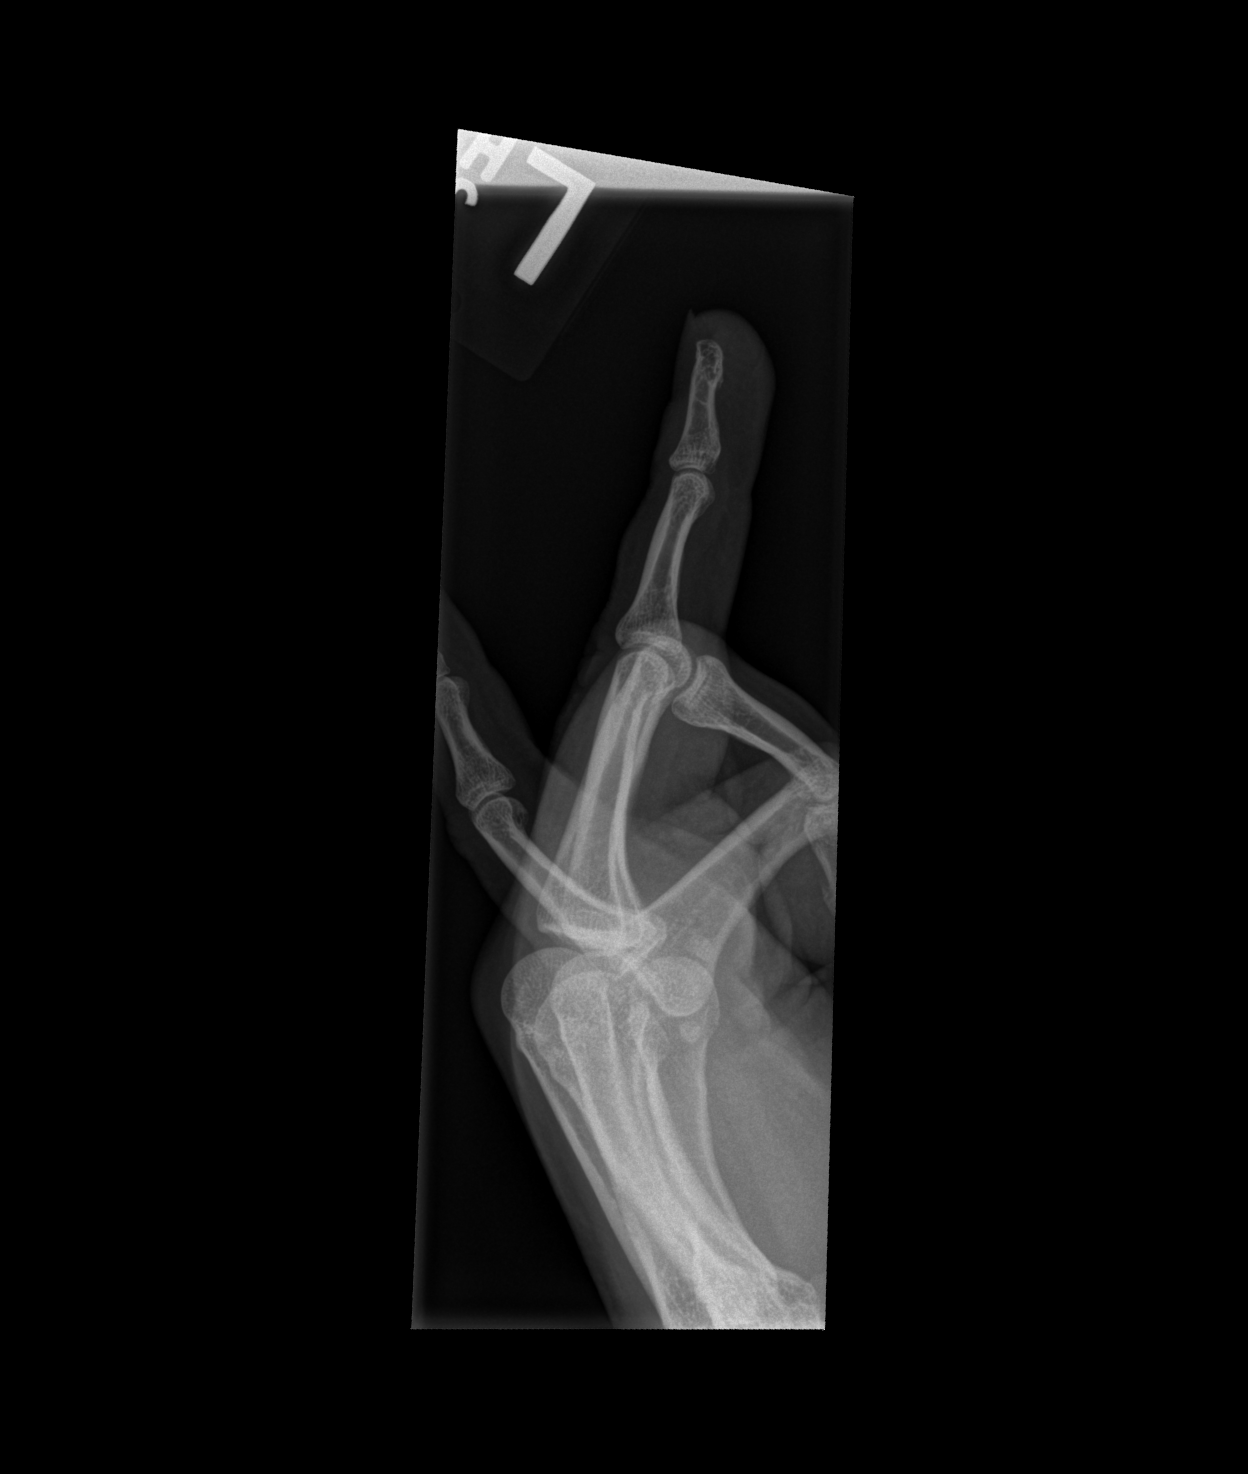

[3 of 3 positions shown; findings below may reference images not displayed]

FINDINGS: There is no acute fracture or dislocation. The bones are well
mineralized. No arthritic changes. The soft tissues are
unremarkable. No radiopaque foreign object or soft tissue gas.
IMPRESSION: Negative.

## 2020-06-24 DIAGNOSIS — Z20822 Contact with and (suspected) exposure to covid-19: Secondary | ICD-10-CM | POA: Diagnosis not present

## 2020-09-29 ENCOUNTER — Ambulatory Visit (HOSPITAL_COMMUNITY): Payer: Self-pay

## 2020-10-26 DIAGNOSIS — I10 Essential (primary) hypertension: Secondary | ICD-10-CM | POA: Diagnosis not present

## 2020-10-27 ENCOUNTER — Ambulatory Visit (HOSPITAL_COMMUNITY)
Admission: RE | Admit: 2020-10-27 | Discharge: 2020-10-27 | Disposition: A | Discharge status: Home / Self Care | Payer: BLUE CROSS/BLUE SHIELD | Source: Ambulatory Visit | Attending: Family Medicine | Admitting: Family Medicine

## 2020-10-27 ENCOUNTER — Other Ambulatory Visit: Payer: Self-pay

## 2020-10-27 ENCOUNTER — Encounter (HOSPITAL_COMMUNITY): Payer: Self-pay

## 2020-10-27 VITALS — BP 143/90 | HR 75 | Temp 98.2°F | Resp 18

## 2020-10-27 DIAGNOSIS — R030 Elevated blood-pressure reading, without diagnosis of hypertension: Secondary | ICD-10-CM | POA: Insufficient documentation

## 2020-10-27 DIAGNOSIS — R635 Abnormal weight gain: Secondary | ICD-10-CM | POA: Diagnosis not present

## 2020-10-27 DIAGNOSIS — R519 Headache, unspecified: Secondary | ICD-10-CM | POA: Insufficient documentation

## 2020-10-27 LAB — CBC WITH DIFFERENTIAL/PLATELET
Abs Immature Granulocytes: 0.01 10*3/uL (ref 0.00–0.07)
Basophils Absolute: 0.1 10*3/uL (ref 0.0–0.1)
Basophils Relative: 1 %
Eosinophils Absolute: 0.2 10*3/uL (ref 0.0–0.5)
Eosinophils Relative: 4 %
HCT: 50.3 % (ref 39.0–52.0)
Hemoglobin: 17.6 g/dL — ABNORMAL HIGH (ref 13.0–17.0)
Immature Granulocytes: 0 %
Lymphocytes Relative: 28 %
Lymphs Abs: 1.6 10*3/uL (ref 0.7–4.0)
MCH: 28.9 pg (ref 26.0–34.0)
MCHC: 35 g/dL (ref 30.0–36.0)
MCV: 82.6 fL (ref 80.0–100.0)
Monocytes Absolute: 0.4 10*3/uL (ref 0.1–1.0)
Monocytes Relative: 7 %
Neutro Abs: 3.4 10*3/uL (ref 1.7–7.7)
Neutrophils Relative %: 60 %
Platelets: 253 10*3/uL (ref 150–400)
RBC: 6.09 MIL/uL — ABNORMAL HIGH (ref 4.22–5.81)
RDW: 12 % (ref 11.5–15.5)
WBC: 5.7 10*3/uL (ref 4.0–10.5)
nRBC: 0 % (ref 0.0–0.2)

## 2020-10-27 LAB — COMPREHENSIVE METABOLIC PANEL
ALT: 78 U/L — ABNORMAL HIGH (ref 0–44)
AST: 36 U/L (ref 15–41)
Albumin: 4 g/dL (ref 3.5–5.0)
Alkaline Phosphatase: 59 U/L (ref 38–126)
Anion gap: 8 (ref 5–15)
BUN: 11 mg/dL (ref 6–20)
CO2: 25 mmol/L (ref 22–32)
Calcium: 9 mg/dL (ref 8.9–10.3)
Chloride: 104 mmol/L (ref 98–111)
Creatinine, Ser: 1.08 mg/dL (ref 0.61–1.24)
GFR, Estimated: >60 mL/min (ref >60)
Glucose, Bld: 90 mg/dL (ref 70–99)
Potassium: 4.2 mmol/L (ref 3.5–5.1)
Sodium: 137 mmol/L (ref 135–145)
Total Bilirubin: 1.2 mg/dL (ref 0.3–1.2)
Total Protein: 7 g/dL (ref 6.5–8.1)

## 2020-10-27 LAB — TSH: TSH: 1.879 u[IU]/mL (ref 0.350–4.500)

## 2020-10-27 MED ORDER — AMLODIPINE BESYLATE 5 MG PO TABS: 5.0000 mg | ORAL_TABLET | Freq: Every day | ORAL | 1 refills | Status: DC

## 2020-10-27 NOTE — ED Triage Notes (Signed)
Pt reports chest pain, left shoulder pain and headache x 3 days. Pt repost last night his  blood pressure 150/100.Denmies vision changes, nausea, dizziness.

## 2020-10-27 NOTE — ED Provider Notes (Signed)
[facility]    CSN: [id number] Arrival date & time: 10/27/20  0946      History   Chief Complaint Chief Complaint  Patient presents with  . Appointment    1000  . Chest Pain    HPI Derrick Goodman is a 35 y.o. male.   Patient presenting today for evaluation of several week history of elevated blood pressure readings, intermittent headaches posteriorly and flushing at times.  He states this has seemed worse the last 2 or 3 days and he was unable to get in with his primary care for another month so wanted to come in and get checked out.  Struggle with elevated blood pressure readings in the past from time to time and notes he gained a lot of weight during COVID which has gotten it more regularly elevated.  Home readings have been ranging from 140-150s/90-100. he denies chest pain, shortness of breath, dizziness, altered mental status, nausea vomiting or diaphoresis.  Strong family history on both sides of hypertension.  Has never been on medication for this in the past.     History reviewed. No pertinent past medical history.  Patient Active Problem List   Diagnosis Date Noted  . URI 11/18/2008  . ABSCESS, TOOTH 09/03/2007  . DEPRESSION 01/15/2007  . ADD 01/15/2007  . HEADACHE 01/15/2007    History reviewed. No pertinent surgical history.     Home Medications    Prior to Admission medications   Medication Sig Start Date End Date Taking? Authorizing Provider  amLODipine (NORVASC) 5 MG tablet Take 1 tablet (5 mg total) by mouth daily. 10/27/20  Yes Particia Nearing, PA-C  omeprazole (PRILOSEC) 20 MG capsule Take 1 capsule (20 mg total) by mouth 2 (two) times daily before a meal. 03/30/19   Elvina Sidle, MD  sucralfate (CARAFATE) 1 GM/10ML suspension Take 10 mLs (1 g total) by mouth 4 (four) times daily -  with meals and at bedtime. 03/30/19   Elvina Sidle, MD    Family History Family History  Problem Relation Age of Onset  . Hypertension  Mother   . Hypertension Father   . Heart disease Maternal Grandfather   . Migraines Maternal Grandfather   . Heart disease Maternal Grandmother   . Migraines Maternal Grandmother   . Heart disease Paternal Grandfather   . Migraines Paternal Grandfather   . Heart disease Paternal Grandmother   . Migraines Paternal Grandmother     Social History Social History   Tobacco Use  . Smoking status: Former Smoker    Packs/day: 0.25    Years: 1.50    Pack years: 0.37    Types: Cigarettes    Quit date: 06/19/2007    Years since quitting: 13.3  . Smokeless tobacco: Never Used  Substance Use Topics  . Alcohol use: Yes    Comment: Occassinally   . Drug use: No     Allergies   Oxycodone-acetaminophen and Oxycodone-acetaminophen   Review of Systems Review of Systems Per HPI  Physical Exam Triage Vital Signs ED Triage Vitals  Enc Vitals Group     BP 10/27/20 1005 (!) 143/90     Pulse Rate 10/27/20 1005 75     Resp 10/27/20 1005 18     Temp 10/27/20 1005 98.2 F (36.8 C)     Temp Source 10/27/20 1005 Oral     SpO2 10/27/20 1005 97 %     Weight --      Height --  Head Circumference --      Peak Flow --      Pain Score 10/27/20 1002 5     Pain Loc --      Pain Edu? --      Excl. in GC? --    No data found.  Updated Vital Signs BP (!) 143/90 (BP Location: Right Arm)   Pulse 75   Temp 98.2 F (36.8 C) (Oral)   Resp 18   SpO2 97%   Visual Acuity Right Eye Distance:   Left Eye Distance:   Bilateral Distance:    Right Eye Near:   Left Eye Near:    Bilateral Near:     Physical Exam Vitals and nursing note reviewed.  Constitutional:      Appearance: Normal appearance.  HENT:     Head: Atraumatic.     Mouth/Throat:     Mouth: Mucous membranes are moist.     Pharynx: Oropharynx is clear.  Eyes:     Extraocular Movements: Extraocular movements intact.     Conjunctiva/sclera: Conjunctivae normal.  Cardiovascular:     Rate and Rhythm: Normal rate and  regular rhythm.  Pulmonary:     Effort: Pulmonary effort is normal. No respiratory distress.     Breath sounds: Normal breath sounds. No wheezing or rales.  Abdominal:     General: Bowel sounds are normal. There is no distension.     Palpations: Abdomen is soft.     Tenderness: There is no abdominal tenderness. There is no guarding.  Musculoskeletal:        General: Normal range of motion.     Cervical back: Normal range of motion and neck supple.  Skin:    General: Skin is warm and dry.  Neurological:     General: No focal deficit present.     Mental Status: He is oriented to person, place, and time.  Psychiatric:        Mood and Affect: Mood normal.        Thought Content: Thought content normal.        Judgment: Judgment normal.     UC Treatments / Results  Labs (all labs ordered are listed, but only abnormal results are displayed) Labs Reviewed  CBC WITH DIFFERENTIAL/PLATELET  COMPREHENSIVE METABOLIC PANEL  TSH    EKG   Radiology No results found.  Procedures Procedures (including critical care time)  Medications Ordered in UC Medications - No data to display  Initial Impression / Assessment and Plan / UC Course  I have reviewed the triage vital signs and the nursing notes.  Pertinent labs & imaging results that were available during my care of the patient were reviewed by me and considered in my medical decision making (see chart for details).     Exam reassuring, vital signs significantly for very mildly elevated blood pressure reading.  EKG today normal sinus rhythm with no acute ST or T wave changes.  We will get some basic labs for rule out serious cause of symptoms, start on amlodipine given the consistency of his elevated home readings and concern for this matter and discussed DASH diet, exercise, stress reduction, close follow-up with PCP for recheck.  He will log his home readings and bring this to his follow-up next month with PCP.  Return sooner for  side effects or worsening symptoms.  Final Clinical Impressions(s) / UC Diagnoses   Final diagnoses:  Elevated blood pressure reading  Nonintractable episodic headache, unspecified headache type   Discharge  Instructions   None    ED Prescriptions    Medication Sig Dispense Auth. Provider   amLODipine (NORVASC) 5 MG tablet Take 1 tablet (5 mg total) by mouth daily. 30 tablet Particia Nearing, [place]     PDMP not reviewed this encounter.   Particia Nearing, [place] 10/27/20 1103

## 2020-12-22 ENCOUNTER — Other Ambulatory Visit: Payer: Self-pay | Admitting: Family Medicine

## 2020-12-26 ENCOUNTER — Encounter (HOSPITAL_COMMUNITY): Payer: Self-pay

## 2020-12-26 ENCOUNTER — Ambulatory Visit (HOSPITAL_COMMUNITY)
Admission: RE | Admit: 2020-12-26 | Discharge: 2020-12-26 | Disposition: A | Discharge status: Home / Self Care | Payer: BLUE CROSS/BLUE SHIELD | Source: Ambulatory Visit | Attending: Student | Admitting: Student

## 2020-12-26 ENCOUNTER — Other Ambulatory Visit: Payer: Self-pay

## 2020-12-26 VITALS — BP 138/93 | HR 73 | Temp 98.3°F | Resp 16

## 2020-12-26 DIAGNOSIS — I10 Essential (primary) hypertension: Secondary | ICD-10-CM | POA: Diagnosis not present

## 2020-12-26 DIAGNOSIS — Z760 Encounter for issue of repeat prescription: Secondary | ICD-10-CM

## 2020-12-26 MED ORDER — AMLODIPINE BESYLATE 5 MG PO TABS: 5.0000 mg | ORAL_TABLET | Freq: Every day | ORAL | 0 refills | Status: DC

## 2020-12-26 NOTE — ED Triage Notes (Signed)
Pt presents for amlodipine 5 mg refill.   Pt reports his blood pressure ir running high. States it was 147/100 2 hrs ago. Reports headache, ear pressure, neck pain. Denies chest pain, SOB, vision changes

## 2020-12-26 NOTE — Discharge Instructions (Addendum)
-  Continue amlodopine once daily -Please check your blood pressure at home or at the pharmacy. If this continues to be >140/90, follow-up with your primary care provider for further blood pressure management/ medication titration. If you develop chest pain, shortness of breath, vision changes, the worst headache of your life- head straight to the ED or call 911.

## 2020-12-26 NOTE — ED Provider Notes (Signed)
[facility]    CSN: [id number] Arrival date & time: 12/26/20  1543      History   Chief Complaint Chief Complaint  Patient presents with   Appointment    1600   Medication Refill    HPI Derrick Goodman is a 35 y.o. male presenting for medication refill-amlodipine.  Medical history hypertension, headaches.  States his blood pressure has been as high as 147/100 at home, he does endorse some mild headaches but denies chest pain, shortness of breath, vision changes, weakness, worst headache of life, thunderclap headache. He was first prescribed the amlodipine 2 months ago at our urgent care and is almost out.  HPI  History reviewed. No pertinent past medical history.  Patient Active Problem List   Diagnosis Date Noted   URI 11/18/2008   ABSCESS, TOOTH 09/03/2007   DEPRESSION 01/15/2007   ADD 01/15/2007   HEADACHE 01/15/2007    History reviewed. No pertinent surgical history.     Home Medications    Prior to Admission medications   Medication Sig Start Date End Date Taking? Authorizing Provider  amLODipine (NORVASC) 5 MG tablet Take 1 tablet (5 mg total) by mouth daily. 12/26/20   Rhys Martini, PA-C  omeprazole (PRILOSEC) 20 MG capsule Take 1 capsule (20 mg total) by mouth 2 (two) times daily before a meal. 03/30/19   Elvina Sidle, MD  sucralfate (CARAFATE) 1 GM/10ML suspension Take 10 mLs (1 g total) by mouth 4 (four) times daily -  with meals and at bedtime. 03/30/19   Elvina Sidle, MD    Family History Family History  Problem Relation Age of Onset   Hypertension Mother    Hypertension Father    Heart disease Maternal Grandfather    Migraines Maternal Grandfather    Heart disease Maternal Grandmother    Migraines Maternal Grandmother    Heart disease Paternal Grandfather    Migraines Paternal Grandfather    Heart disease Paternal Grandmother    Migraines Paternal Grandmother     Social History Social History   Tobacco Use    Smoking status: Former    Packs/day: 0.25    Years: 1.50    Pack years: 0.38    Types: Cigarettes    Quit date: 06/19/2007    Years since quitting: 13.5   Smokeless tobacco: Never  Vaping Use   Vaping Use: Never used  Substance Use Topics   Alcohol use: Yes    Comment: Occassinally    Drug use: No     Allergies   Oxycodone-acetaminophen and Oxycodone-acetaminophen   Review of Systems Review of Systems  All other systems reviewed and are negative.   Physical Exam Triage Vital Signs ED Triage Vitals [12/26/20 1645]  Enc Vitals Group     BP (!) 138/93     Pulse Rate 73     Resp 16     Temp 98.3 F (36.8 C)     Temp Source Oral     SpO2 100 %     Weight      Height      Head Circumference      Peak Flow      Pain Score 2     Pain Loc      Pain Edu?      Excl. in GC?    No data found.  Updated Vital Signs BP (!) 138/93 (BP Location: Left Arm)   Pulse 73   Temp 98.3 F (36.8 C) (Oral)   Resp 16  SpO2 100%   Visual Acuity Right Eye Distance:   Left Eye Distance:   Bilateral Distance:    Right Eye Near:   Left Eye Near:    Bilateral Near:     Physical Exam Vitals reviewed.  Constitutional:      Appearance: Normal appearance. He is not diaphoretic.  HENT:     Head: Normocephalic and atraumatic.     Mouth/Throat:     Mouth: Mucous membranes are moist.  Eyes:     Extraocular Movements: Extraocular movements intact.     Pupils: Pupils are equal, round, and reactive to light.  Cardiovascular:     Rate and Rhythm: Normal rate and regular rhythm.     Pulses:          Radial pulses are 2+ on the right side and 2+ on the left side.     Heart sounds: Normal heart sounds.  Pulmonary:     Effort: Pulmonary effort is normal.     Breath sounds: Normal breath sounds.  Abdominal:     Palpations: Abdomen is soft.     Tenderness: There is no abdominal tenderness. There is no guarding or rebound.  Musculoskeletal:     Right lower leg: No edema.     Left  lower leg: No edema.  Skin:    General: Skin is warm.     Capillary Refill: Capillary refill takes less than 2 seconds.  Neurological:     General: No focal deficit present.     Mental Status: He is alert and oriented to person, place, and time.     Comments: PERRLA, EOMI CN 2-12 grossly intact Sensation and strength intact UEs and LEs   Psychiatric:        Mood and Affect: Mood normal.        Behavior: Behavior normal.        Thought Content: Thought content normal.        Judgment: Judgment normal.     UC Treatments / Results  Labs (all labs ordered are listed, but only abnormal results are displayed) Labs Reviewed - No data to display  EKG   Radiology No results found.  Procedures Procedures (including critical care time)  Medications Ordered in UC Medications - No data to display  Initial Impression / Assessment and Plan / UC Course  I have reviewed the triage vital signs and the nursing notes.  Pertinent labs & imaging results that were available during my care of the patient were reviewed by me and considered in my medical decision making (see chart for details).     This patient is a very pleasant 35 y.o. year old male presenting for medication refill for hypertension- amlodipine. BP currently fairly well controlled on amlodipine 5 mg at 138/93.  He has an appointment to establish care with primary care in 1 week.  Refilled the amlodipine, will defer further titration to primary care. ED return precautions discussed. Patient verbalizes understanding and agreement.    Final Clinical Impressions(s) / UC Diagnoses   Final diagnoses:  Essential hypertension  Medication refill     Discharge Instructions      -Continue amlodopine once daily -Please check your blood pressure at home or at the pharmacy. If this continues to be >140/90, follow-up with your primary care provider for further blood pressure management/ medication titration. If you develop chest  pain, shortness of breath, vision changes, the worst headache of your life- head straight to the ED or call 911.  ED Prescriptions     Medication Sig Dispense Auth. Provider   amLODipine (NORVASC) 5 MG tablet Take 1 tablet (5 mg total) by mouth daily. 30 tablet Rhys Martini, PA-C      PDMP not reviewed this encounter.   Rhys Martini, PA-C 12/26/20 1756

## 2021-01-03 ENCOUNTER — Other Ambulatory Visit: Payer: Self-pay

## 2021-01-03 ENCOUNTER — Encounter: Payer: Self-pay | Admitting: Emergency Medicine

## 2021-01-03 ENCOUNTER — Ambulatory Visit (INDEPENDENT_AMBULATORY_CARE_PROVIDER_SITE_OTHER): Payer: BLUE CROSS/BLUE SHIELD | Admitting: Emergency Medicine

## 2021-01-03 VITALS — BP 136/82 | HR 94 | Ht 67.0 in | Wt 203.0 lb

## 2021-01-03 DIAGNOSIS — I10 Essential (primary) hypertension: Secondary | ICD-10-CM

## 2021-01-03 DIAGNOSIS — Z7689 Persons encountering health services in other specified circumstances: Secondary | ICD-10-CM | POA: Diagnosis not present

## 2021-01-03 DIAGNOSIS — F32A Depression, unspecified: Secondary | ICD-10-CM | POA: Diagnosis not present

## 2021-01-03 MED ORDER — ESCITALOPRAM OXALATE 10 MG PO TABS: 10.0000 mg | ORAL_TABLET | Freq: Every day | ORAL | 1 refills | Status: DC

## 2021-01-03 NOTE — Patient Instructions (Signed)

## 2021-01-03 NOTE — Progress Notes (Signed)
Derrick MaduraJeremy Goodman 35 y.o.   Chief Complaint  Patient presents with   Hypertension    Discuss BP and anxiety, see ph-9    HISTORY OF PRESENT ILLNESS: This is a 35 y.o. male first visit to this office here to establish care with me. Has history of hypertension on amlodipine 5 mg daily. History of chronic depression for several years on no medications.  No psychiatry evaluation. Recent labs from last May reviewed with patient.  Unremarkable labs with acceptable values.  Hypertension Pertinent negatives include no chest pain, headaches, palpitations or shortness of breath.    Prior to Admission medications   Medication Sig Start Date End Date Taking? Authorizing Provider  amLODipine (NORVASC) 5 MG tablet Take 1 tablet (5 mg total) by mouth daily. 12/26/20  Yes Rhys MartiniGraham, Laura E, PA-C  omeprazole (PRILOSEC) 20 MG capsule Take 1 capsule (20 mg total) by mouth 2 (two) times daily before a meal. Patient not taking: Reported on 01/03/2021 03/30/19   Elvina SidleLauenstein, Kurt, MD  sucralfate (CARAFATE) 1 GM/10ML suspension Take 10 mLs (1 g total) by mouth 4 (four) times daily -  with meals and at bedtime. Patient not taking: Reported on 01/03/2021 03/30/19   Elvina SidleLauenstein, Kurt, MD    Allergies  Allergen Reactions   Oxycodone-Acetaminophen Nausea And Vomiting    REACTION: vomit   Oxycodone-Acetaminophen     REACTION: vomit    Patient Active Problem List   Diagnosis Date Noted   DEPRESSION 01/15/2007   ADD 01/15/2007    History reviewed. No pertinent past medical history.  History reviewed. No pertinent surgical history.  Social History   Socioeconomic History   Marital status: Single    Spouse name: Not on file   Number of children: Not on file   Years of education: Not on file   Highest education level: Not on file  Occupational History   Not on file  Tobacco Use   Smoking status: Former    Packs/day: 0.25    Years: 1.50    Pack years: 0.38    Types: Cigarettes    Quit date:  06/19/2007    Years since quitting: 13.5   Smokeless tobacco: Never  Vaping Use   Vaping Use: Never used  Substance and Sexual Activity   Alcohol use: Yes    Comment: Occassinally    Drug use: No   Sexual activity: Not on file  Other Topics Concern   Not on file  Social History Narrative   Not on file   Social Determinants of Health   Financial Resource Strain: Not on file  Food Insecurity: Not on file  Transportation Needs: Not on file  Physical Activity: Not on file  Stress: Not on file  Social Connections: Not on file  Intimate Partner Violence: Not on file    Family History  Problem Relation Age of Onset   Hypertension Mother    Hypertension Father    Heart disease Maternal Grandfather    Migraines Maternal Grandfather    Heart disease Maternal Grandmother    Migraines Maternal Grandmother    Heart disease Paternal Grandfather    Migraines Paternal Grandfather    Heart disease Paternal Grandmother    Migraines Paternal Grandmother      Review of Systems  Constitutional: Negative.  Negative for chills and fever.  HENT: Negative.  Negative for congestion and sore throat.   Respiratory: Negative.  Negative for cough and shortness of breath.   Cardiovascular: Negative.  Negative for chest pain and palpitations.  Gastrointestinal: Negative.  Negative for abdominal pain, diarrhea, nausea and vomiting.  Genitourinary: Negative.  Negative for dysuria and hematuria.  Skin: Negative.  Negative for rash.  Neurological: Negative.  Negative for dizziness and headaches.  Psychiatric/Behavioral:  Positive for depression.   All other systems reviewed and are negative.  Today's Vitals   01/03/21 1256  BP: 136/82  Pulse: 94  SpO2: 94%  Weight: 203 lb (92.1 kg)  Height: 5\' 7"  (1.702 m)   Body mass index is 31.79 kg/m. Wt Readings from Last 3 Encounters:  01/03/21 203 lb (92.1 kg)  03/19/16 190 lb (86.2 kg)  04/25/14 187 lb (84.8 kg)    Physical Exam Vitals  reviewed.  Constitutional:      Appearance: Normal appearance.  HENT:     Head: Normocephalic.  Eyes:     Extraocular Movements: Extraocular movements intact.     Conjunctiva/sclera: Conjunctivae normal.     Pupils: Pupils are equal, round, and reactive to light.  Cardiovascular:     Rate and Rhythm: Normal rate and regular rhythm.     Pulses: Normal pulses.     Heart sounds: Normal heart sounds.  Pulmonary:     Effort: Pulmonary effort is normal.     Breath sounds: Normal breath sounds.  Abdominal:     Palpations: Abdomen is soft.     Tenderness: There is no abdominal tenderness.  Musculoskeletal:        General: Normal range of motion.     Cervical back: Normal range of motion and neck supple.  Skin:    General: Skin is warm and dry.     Capillary Refill: Capillary refill takes less than 2 seconds.  Neurological:     General: No focal deficit present.     Mental Status: He is alert and oriented to person, place, and time.  Psychiatric:        Mood and Affect: Mood normal.        Behavior: Behavior normal.     ASSESSMENT & PLAN: A total of 30 minutes was spent with the patient and counseling/coordination of care regarding preparing for this visit, review of most recent office visit notes from previous doctors, review of all medications, review of recent urgent care visit review of recent blood work results which were unremarkable, education on nutrition, chronic depression and need for treatment and psychiatry evaluation, treatment with Lexapro 10 mg daily, prognosis, documentation and need for follow-up in 3 months.  Essential hypertension Well-controlled hypertension.  Continue amlodipine 5 mg daily. Advised to continue monitoring blood pressure readings at home and keep a log. Dietary approaches to stop hypertension discussed. Follow-up in 3 months.  Chronic depression Currently active.  We will start Lexapro 10 mg daily and refer to psychiatry. Follow-up in 3  months  Taylen was seen today for hypertension.  Diagnoses and all orders for this visit:  Essential hypertension  Encounter to establish care  Chronic depression -     escitalopram (LEXAPRO) 10 MG tablet; Take 1 tablet (10 mg total) by mouth daily. -     Ambulatory referral to Psychiatry   Patient Instructions  Hypertension, Adult High blood pressure (hypertension) is when the force of blood pumping through the arteries is too strong. The arteries are the blood vessels that carry blood from the heart throughout the body. Hypertension forces the heart to work harder to pump blood and may cause arteries to become narrow or stiff. Untreated or uncontrolled hypertension can cause a heart attack, heart  failure, a stroke, kidney disease, and otherproblems. A blood pressure reading consists of a higher number over a lower number. Ideally, your blood pressure should be below 120/80. The first ("top") number is called the systolic pressure. It is a measure of the pressure in your arteries as your heart beats. The second ("bottom") number is called the diastolic pressure. It is a measure of the pressure in your arteries as theheart relaxes. What are the causes? The exact cause of this condition is not known. There are some conditions thatresult in or are related to high blood pressure. What increases the risk? Some risk factors for high blood pressure are under your control. The following factors may make you more likely to develop this condition: Smoking. Having type 2 diabetes mellitus, high cholesterol, or both. Not getting enough exercise or physical activity. Being overweight. Having too much fat, sugar, calories, or salt (sodium) in your diet. Drinking too much alcohol. Some risk factors for high blood pressure may be difficult or impossible to change. Some of these factors include: Having chronic kidney disease. Having a family history of high blood pressure. Age. Risk increases with  age. Race. You may be at higher risk if you are African American. Gender. Men are at higher risk than women before age 38. After age 35, women are at higher risk than men. Having obstructive sleep apnea. Stress. What are the signs or symptoms? High blood pressure may not cause symptoms. Very high blood pressure (hypertensive crisis) may cause: Headache. Anxiety. Shortness of breath. Nosebleed. Nausea and vomiting. Vision changes. Severe chest pain. Seizures. How is this diagnosed? This condition is diagnosed by measuring your blood pressure while you are seated, with your arm resting on a flat surface, your legs uncrossed, and your feet flat on the floor. The cuff of the blood pressure monitor will be placed directly against the skin of your upper arm at the level of your heart. It should be measured at least twice using the same arm. Certain conditions cancause a difference in blood pressure between your right and left arms. Certain factors can cause blood pressure readings to be lower or higher than normal for a short period of time: When your blood pressure is higher when you are in a health care provider's office than when you are at home, this is called white coat hypertension. Most people with this condition do not need medicines. When your blood pressure is higher at home than when you are in a health care provider's office, this is called masked hypertension. Most people with this condition may need medicines to control blood pressure. If you have a high blood pressure reading during one visit or you have normal blood pressure with other risk factors, you may be asked to: Return on a different day to have your blood pressure checked again. Monitor your blood pressure at home for 1 week or longer. If you are diagnosed with hypertension, you may have other blood or imaging tests to help your health care provider understand your overall risk for otherconditions. How is this  treated? This condition is treated by making healthy lifestyle changes, such as eating healthy foods, exercising more, and reducing your alcohol intake. Your health care provider may prescribe medicine if lifestyle changes are not enough to get your blood pressure under control, and if: Your systolic blood pressure is above 130. Your diastolic blood pressure is above 80. Your personal target blood pressure may vary depending on your medicalconditions, your age, and other factors.  Follow these instructions at home: Eating and drinking  Eat a diet that is high in fiber and potassium, and low in sodium, added sugar, and fat. An example eating plan is called the DASH (Dietary Approaches to Stop Hypertension) diet. To eat this way: Eat plenty of fresh fruits and vegetables. Try to fill one half of your plate at each meal with fruits and vegetables. Eat whole grains, such as whole-wheat pasta, brown rice, or whole-grain bread. Fill about one fourth of your plate with whole grains. Eat or drink low-fat dairy products, such as skim milk or low-fat yogurt. Avoid fatty cuts of meat, processed or cured meats, and poultry with skin. Fill about one fourth of your plate with lean proteins, such as fish, chicken without skin, beans, eggs, or tofu. Avoid pre-made and processed foods. These tend to be higher in sodium, added sugar, and fat. Reduce your daily sodium intake. Most people with hypertension should eat less than 1,500 mg of sodium a day. Do not drink alcohol if: Your health care provider tells you not to drink. You are pregnant, may be pregnant, or are planning to become pregnant. If you drink alcohol: Limit how much you use to: 0-1 drink a day for women. 0-2 drinks a day for men. Be aware of how much alcohol is in your drink. In the [place] one drink equals one 12 oz bottle of beer (355 mL), one 5 oz glass of wine (148 mL), or one 1 oz glass of hard liquor (44 mL).  Lifestyle  Work with your  health care provider to maintain a healthy body weight or to lose weight. Ask what an ideal weight is for you. Get at least 30 minutes of exercise most days of the week. Activities may include walking, swimming, or biking. Include exercise to strengthen your muscles (resistance exercise), such as Pilates or lifting weights, as part of your weekly exercise routine. Try to do these types of exercises for 30 minutes at least 3 days a week. Do not use any products that contain nicotine or tobacco, such as cigarettes, e-cigarettes, and chewing tobacco. If you need help quitting, ask your health care provider. Monitor your blood pressure at home as told by your health care provider. Keep all follow-up visits as told by your health care provider. This is important.  Medicines Take over-the-counter and prescription medicines only as told by your health care provider. Follow directions carefully. Blood pressure medicines must be taken as prescribed. Do not skip doses of blood pressure medicine. Doing this puts you at risk for problems and can make the medicine less effective. Ask your health care provider about side effects or reactions to medicines that you should watch for. Contact a health care provider if you: Think you are having a reaction to a medicine you are taking. Have headaches that keep coming back (recurring). Feel dizzy. Have swelling in your ankles. Have trouble with your vision. Get help right away if you: Develop a severe headache or confusion. Have unusual weakness or numbness. Feel faint. Have severe pain in your chest or abdomen. Vomit repeatedly. Have trouble breathing. Summary Hypertension is when the force of blood pumping through your arteries is too strong. If this condition is not controlled, it may put you at risk for serious complications. Your personal target blood pressure may vary depending on your medical conditions, your age, and other factors. For most people, a  normal blood pressure is less than 120/80. Hypertension is treated with lifestyle changes, medicines,  or a combination of both. Lifestyle changes include losing weight, eating a healthy, low-sodium diet, exercising more, and limiting alcohol. This information is not intended to replace advice given to you by your health care provider. Make sure you discuss any questions you have with your healthcare provider. Document Revised: 02/12/2018 Document Reviewed: 02/12/2018 Elsevier Patient Education  2022 Elsevier Inc.   Edwina Barth, MD [facility]

## 2021-01-03 NOTE — Assessment & Plan Note (Signed)
Well-controlled hypertension.  Continue amlodipine 5 mg daily. Advised to continue monitoring blood pressure readings at home and keep a log. Dietary approaches to stop hypertension discussed. Follow-up in 3 months.

## 2021-01-03 NOTE — Assessment & Plan Note (Signed)
Currently active.  We will start Lexapro 10 mg daily and refer to psychiatry. Follow-up in 3 months

## 2021-01-26 ENCOUNTER — Other Ambulatory Visit: Payer: Self-pay | Admitting: Emergency Medicine

## 2021-04-05 ENCOUNTER — Telehealth: Payer: Managed Care, Other (non HMO) | Admitting: Emergency Medicine

## 2021-04-05 ENCOUNTER — Telehealth: Payer: Self-pay | Admitting: Emergency Medicine

## 2021-04-05 ENCOUNTER — Ambulatory Visit: Payer: BLUE CROSS/BLUE SHIELD | Admitting: Emergency Medicine

## 2021-04-05 NOTE — Telephone Encounter (Signed)
Attempted to connect twice via phone without success. MyChart video communication was also attempted without success despite invitation sent.

## 2021-04-13 ENCOUNTER — Telehealth: Payer: Self-pay | Admitting: Emergency Medicine

## 2021-05-05 ENCOUNTER — Other Ambulatory Visit: Payer: Self-pay | Admitting: Emergency Medicine

## 2021-06-20 ENCOUNTER — Other Ambulatory Visit: Payer: Self-pay | Admitting: Emergency Medicine

## 2021-06-20 DIAGNOSIS — F32A Depression, unspecified: Secondary | ICD-10-CM

## 2021-09-17 ENCOUNTER — Other Ambulatory Visit: Payer: Self-pay | Admitting: Emergency Medicine

## 2021-09-17 DIAGNOSIS — F32A Depression, unspecified: Secondary | ICD-10-CM

## 2021-11-10 ENCOUNTER — Other Ambulatory Visit: Payer: Self-pay | Admitting: Emergency Medicine

## 2022-02-01 ENCOUNTER — Other Ambulatory Visit: Payer: Self-pay

## 2022-02-01 ENCOUNTER — Encounter (HOSPITAL_COMMUNITY): Payer: Self-pay

## 2022-02-01 ENCOUNTER — Emergency Department (HOSPITAL_COMMUNITY)
Admission: EM | Admit: 2022-02-01 | Discharge: 2022-02-01 | Disposition: A | Discharge status: Home / Self Care | Payer: Managed Care, Other (non HMO) | Attending: Emergency Medicine | Admitting: Emergency Medicine

## 2022-02-01 DIAGNOSIS — I10 Essential (primary) hypertension: Secondary | ICD-10-CM | POA: Insufficient documentation

## 2022-02-01 DIAGNOSIS — H6091 Unspecified otitis externa, right ear: Secondary | ICD-10-CM | POA: Insufficient documentation

## 2022-02-01 DIAGNOSIS — Z87891 Personal history of nicotine dependence: Secondary | ICD-10-CM | POA: Insufficient documentation

## 2022-02-01 DIAGNOSIS — H60311 Diffuse otitis externa, right ear: Secondary | ICD-10-CM

## 2022-02-01 DIAGNOSIS — Z79899 Other long term (current) drug therapy: Secondary | ICD-10-CM | POA: Insufficient documentation

## 2022-02-01 MED ORDER — CIPROFLOXACIN-DEXAMETHASONE 0.3-0.1 % OT SUSP: 4.0000 [drp] | Freq: Two times a day (BID) | OTIC | 0 refills | Status: AC

## 2022-02-01 MED ORDER — CEPHALEXIN 500 MG PO CAPS: 500.0000 mg | ORAL_CAPSULE | Freq: Four times a day (QID) | ORAL | 0 refills | Status: AC

## 2022-02-01 MED ORDER — IBUPROFEN 200 MG PO TABS
600.0000 mg | ORAL_TABLET | Freq: Once | ORAL | Status: AC
  Administered 2022-02-01: 600 mg via ORAL
  Filled 2022-02-01: qty 3

## 2022-02-01 MED ORDER — CIPROFLOXACIN-DEXAMETHASONE 0.3-0.1 % OT SUSP
4.0000 [drp] | Freq: Once | OTIC | Status: AC
  Administered 2022-02-01: 4 [drp] via OTIC
  Filled 2022-02-01: qty 7.5

## 2022-02-01 NOTE — ED Provider Notes (Signed)
[facility]-EMERGENCY DEPT Provider Note   CSN: [id number] Arrival date & time: 02/01/22  1723     History  Chief Complaint  Patient presents with   Otalgia    Derrick Goodman is a 36 y.o. male.   Otalgia   36 year old male presents emergency department with complaints of right ear pain.  Patient states symptoms began insidiously Tuesday morning.  He notes no drainage from ear but states the pain is gotten increasingly worse.  He was seen by urgent care 2 days ago and was given eardrops for outer ear infection.  He states he has been using 1 drop 4 times a day when the Corticosporin is 4 drops 4 times a day.  He states that proper medication use was not communicated to him.  He notes persistence of pain and mild hearing deficits in affected ear.  Denies any known drainage, fever, chills, night sweats, neck stiffness, sore throat, runny nose, sinus pressure.  Past medical history significant for depression, hypertension, ADD  Home Medications Prior to Admission medications   Medication Sig Start Date End Date Taking? Authorizing Provider  cephALEXin (KEFLEX) 500 MG capsule Take 1 capsule (500 mg total) by mouth 4 (four) times daily. 02/01/22  Yes Sherian Maroon A, PA  ciprofloxacin-dexamethasone (CIPRODEX) OTIC suspension Place 4 drops into the right ear 2 (two) times daily. 02/01/22  Yes Sherian Maroon A, PA  amLODipine (NORVASC) 5 MG tablet TAKE 1 TABLET(5 MG) BY MOUTH DAILY 11/10/21   Georgina Quint, MD  escitalopram (LEXAPRO) 10 MG tablet TAKE 1 TABLET(10 MG) BY MOUTH DAILY 06/20/21   Georgina Quint, MD  omeprazole (PRILOSEC) 20 MG capsule Take 1 capsule (20 mg total) by mouth 2 (two) times daily before a meal. Patient not taking: Reported on 01/03/2021 03/30/19   Elvina Sidle, MD  sucralfate (CARAFATE) 1 GM/10ML suspension Take 10 mLs (1 g total) by mouth 4 (four) times daily -  with meals and at bedtime. Patient not taking: Reported on  01/03/2021 03/30/19   Elvina Sidle, MD      Allergies    Oxycodone-acetaminophen and Oxycodone-acetaminophen    Review of Systems   Review of Systems  HENT:  Positive for ear pain.   All other systems reviewed and are negative.   Physical Exam Updated Vital Signs BP (!) 138/95   Pulse 79   Temp 97.9 F (36.6 C)   Resp 17   Ht 5\' 7"  (1.702 m)   SpO2 98%   BMI 31.79 kg/m  Physical Exam Vitals and nursing note reviewed.  Constitutional:      General: He is not in acute distress.    Appearance: He is well-developed.  HENT:     Head: Normocephalic and atraumatic.     Left Ear: Tympanic membrane, ear canal and external ear normal.     Ears:     Comments: Right temporal membrane nonabsorbable due to drainage and outer ear canal.  External ear canal erythematous in appearance with erythema extending to concha of the outer ear.  No erythema noted on mastoid process.      Nose: Nose normal.     Mouth/Throat:     Mouth: Mucous membranes are moist.     Pharynx: Oropharynx is clear.  Eyes:     Conjunctiva/sclera: Conjunctivae normal.  Cardiovascular:     Rate and Rhythm: Normal rate and regular rhythm.     Heart sounds: No murmur heard. Pulmonary:     Effort: Pulmonary effort is normal.  No respiratory distress.     Breath sounds: Normal breath sounds.  Abdominal:     Palpations: Abdomen is soft.     Tenderness: There is no abdominal tenderness.  Musculoskeletal:        General: No swelling.     Cervical back: Neck supple. No rigidity or tenderness.  Lymphadenopathy:     Cervical: No cervical adenopathy.  Skin:    General: Skin is warm and dry.     Capillary Refill: Capillary refill takes less than 2 seconds.  Neurological:     Mental Status: He is alert.  Psychiatric:        Mood and Affect: Mood normal.     ED Results / Procedures / Treatments   Labs (all labs ordered are listed, but only abnormal results are displayed) Labs Reviewed - No data to  display  EKG None  Radiology No results found.  Procedures Procedures    Medications Ordered in ED Medications  ciprofloxacin-dexamethasone (CIPRODEX) 0.3-0.1 % OTIC (EAR) suspension 4 drop (4 drops Right EAR Given 02/01/22 1822)  ibuprofen (ADVIL) tablet 600 mg (600 mg Oral Given 02/01/22 1822)    ED Course/ Medical Decision Making/ A&P                           Medical Decision Making Risk OTC drugs. Prescription drug management.   This patient presents to the ED for concern of ear pain, this involves an extensive number of treatment options, and is a complaint that carries with it a high risk of complications and morbidity.  The differential diagnosis includes otitis externa, otitis media, perforated TM, mastoiditis, meningitis   Co morbidities that complicate the patient evaluation  See HPI   Additional history obtained:  Additional history obtained from EMR External records from outside source obtained and reviewed including antibiotics as prescribed from urgent care   Lab Tests:  N/a   Imaging Studies ordered:  N/a   Cardiac Monitoring: / EKG:  The patient was maintained on a cardiac monitor.  I personally viewed and interpreted the cardiac monitored which showed an underlying rhythm of: Sinus rhythm   Consultations Obtained:  N/a   Problem List / ED Course / Critical interventions / Medication management  Your pain I ordered medication including Ciprodex for otitis externa   Reevaluation of the patient after these medicines showed that the patient improved I have reviewed the patients home medicines and have made adjustments as needed   Social Determinants of Health:  Former cigarette use quit 2009.  Denies illicit drug use   Test / Admission - Considered:  Undertreated otitis externa Vitals signs significant for mild hypertension with a blood pressure of 138/95.  Recommend close follow-up with PCP regarding mild elevation of blood  pressure. Otherwise within normal range and stable throughout visit. Imaging and laboratory studies considered but deemed necessary due to reassuring physical exam. Patient symptoms most likely secondary to undertreated otitis externa.  Patient states he is unaware of exact treatment regimen of medication prescribed by the urgent care.  Doubt mastoiditis.  Doubt meningitis.  Antibiotic to Ciprodex as per patient.  Given new cellulitic skin changes of external ear, oral antibiotics also prescribed to patient.  Reevaluation in 2 to 3 days recommended to make sure patient symptoms are improving.  Treatment plan was discussed at length with patient and he acknowledged understanding was agreeable to said plan. Worrisome signs and symptoms were discussed with the patient, and the  patient acknowledged understanding to return to the ED if noticed. Patient was stable upon discharge.         Final Clinical Impression(s) / ED Diagnoses Final diagnoses:  Diffuse otitis externa of right ear, unspecified chronicity    Rx / DC Orders ED Discharge Orders          Ordered    ciprofloxacin-dexamethasone (CIPRODEX) OTIC suspension  2 times daily        02/01/22 1833    cephALEXin (KEFLEX) 500 MG capsule  4 times daily        02/01/22 1833              Peter Garter, PA 02/01/22 1833    Phoebe Sharps, DO 02/02/22 0018

## 2022-02-01 NOTE — Discharge Instructions (Signed)
Note your visit to the emergency department showed continued outer ear infection.  We will treat this with a new antibiotic drop.  Use this drop 4 times a day twice daily/every 12 hours.  We also start an oral antibiotic to use every 6 hours for the next 5 days.  You can take ibuprofen as needed for pain/inflammation.  Reevaluation recommended in 2 to 3 days to make sure proper response to antibiotic therapy.  Please not hesitate to return to the emergency department if the worrisome signs and symptoms we discussed become apparent.

## 2022-02-01 NOTE — ED Triage Notes (Signed)
Pt arrives via EMS from home. Pt c/o right ear pain since Tuesday.

## 2022-02-02 ENCOUNTER — Ambulatory Visit: Payer: Self-pay

## 2022-02-14 ENCOUNTER — Ambulatory Visit: Payer: Self-pay

## 2022-02-16 ENCOUNTER — Ambulatory Visit: Payer: Self-pay

## 2022-03-17 ENCOUNTER — Other Ambulatory Visit: Payer: Self-pay | Admitting: Emergency Medicine

## 2022-03-17 DIAGNOSIS — F32A Depression, unspecified: Secondary | ICD-10-CM

## 2022-07-11 ENCOUNTER — Other Ambulatory Visit: Payer: Self-pay | Admitting: Emergency Medicine

## 2022-10-31 ENCOUNTER — Other Ambulatory Visit: Payer: Self-pay | Admitting: Emergency Medicine

## 2023-03-09 ENCOUNTER — Other Ambulatory Visit: Payer: Self-pay | Admitting: Emergency Medicine

## 2023-03-09 DIAGNOSIS — F32A Depression, unspecified: Secondary | ICD-10-CM

## 2023-03-11 ENCOUNTER — Other Ambulatory Visit: Payer: Self-pay | Admitting: Emergency Medicine
